# Patient Record
Sex: Female | Born: 1965 | Race: White | Hispanic: No | Marital: Married | Smoking: Never smoker
Health system: Southern US, Community
[De-identification: ages and names within clinical notes are randomized; demographics above are authoritative.]

## PROBLEM LIST (undated history)

## (undated) DIAGNOSIS — G43909 Migraine, unspecified, not intractable, without status migrainosus: Secondary | ICD-10-CM

## (undated) HISTORY — PX: ANKLE SURGERY: SHX546

## (undated) HISTORY — PX: TUBAL LIGATION: SHX77

---

## 2007-02-07 DIAGNOSIS — G43109 Migraine with aura, not intractable, without status migrainosus: Secondary | ICD-10-CM

## 2007-02-07 HISTORY — DX: Migraine with aura, not intractable, without status migrainosus: G43.109

## 2018-12-10 DIAGNOSIS — K7689 Other specified diseases of liver: Secondary | ICD-10-CM | POA: Insufficient documentation

## 2021-07-11 ENCOUNTER — Encounter (HOSPITAL_COMMUNITY): Payer: Self-pay | Admitting: *Deleted

## 2021-07-11 ENCOUNTER — Emergency Department (HOSPITAL_COMMUNITY): Payer: Managed Care, Other (non HMO)

## 2021-07-11 ENCOUNTER — Observation Stay (HOSPITAL_COMMUNITY)
Admission: EM | Admit: 2021-07-11 | Discharge: 2021-07-12 | Disposition: A | Discharge status: Home / Self Care | Payer: Managed Care, Other (non HMO) | Attending: Internal Medicine | Admitting: Internal Medicine

## 2021-07-11 DIAGNOSIS — E785 Hyperlipidemia, unspecified: Secondary | ICD-10-CM

## 2021-07-11 DIAGNOSIS — G43809 Other migraine, not intractable, without status migrainosus: Secondary | ICD-10-CM | POA: Diagnosis not present

## 2021-07-11 DIAGNOSIS — I10 Essential (primary) hypertension: Secondary | ICD-10-CM | POA: Diagnosis not present

## 2021-07-11 DIAGNOSIS — Z79899 Other long term (current) drug therapy: Secondary | ICD-10-CM | POA: Insufficient documentation

## 2021-07-11 DIAGNOSIS — E876 Hypokalemia: Secondary | ICD-10-CM

## 2021-07-11 DIAGNOSIS — R531 Weakness: Secondary | ICD-10-CM | POA: Diagnosis present

## 2021-07-11 DIAGNOSIS — R569 Unspecified convulsions: Secondary | ICD-10-CM

## 2021-07-11 DIAGNOSIS — G43909 Migraine, unspecified, not intractable, without status migrainosus: Secondary | ICD-10-CM

## 2021-07-11 DIAGNOSIS — G43109 Migraine with aura, not intractable, without status migrainosus: Secondary | ICD-10-CM

## 2021-07-11 DIAGNOSIS — G459 Transient cerebral ischemic attack, unspecified: Principal | ICD-10-CM | POA: Insufficient documentation

## 2021-07-11 HISTORY — DX: Migraine, unspecified, not intractable, without status migrainosus: G43.909

## 2021-07-11 LAB — DIFFERENTIAL
Abs Immature Granulocytes: 0.02 10*3/uL (ref 0.00–0.07)
Basophils Absolute: 0 10*3/uL (ref 0.0–0.1)
Basophils Relative: 0 %
Eosinophils Absolute: 0 10*3/uL (ref 0.0–0.5)
Eosinophils Relative: 1 %
Immature Granulocytes: 0 %
Lymphocytes Relative: 27 %
Lymphs Abs: 1.5 10*3/uL (ref 0.7–4.0)
Monocytes Absolute: 0.3 10*3/uL (ref 0.1–1.0)
Monocytes Relative: 6 %
Neutro Abs: 3.7 10*3/uL (ref 1.7–7.7)
Neutrophils Relative %: 66 %

## 2021-07-11 LAB — COMPREHENSIVE METABOLIC PANEL
ALT: 27 U/L (ref 0–44)
AST: 25 U/L (ref 15–41)
Albumin: 4.2 g/dL (ref 3.5–5.0)
Alkaline Phosphatase: 43 U/L (ref 38–126)
Anion gap: 13 (ref 5–15)
BUN: 11 mg/dL (ref 6–20)
CO2: 20 mmol/L — ABNORMAL LOW (ref 22–32)
Calcium: 9.3 mg/dL (ref 8.9–10.3)
Chloride: 103 mmol/L (ref 98–111)
Creatinine, Ser: 0.79 mg/dL (ref 0.44–1.00)
GFR, Estimated: >60 mL/min (ref >60)
Glucose, Bld: 137 mg/dL — ABNORMAL HIGH (ref 70–99)
Potassium: 3 mmol/L — ABNORMAL LOW (ref 3.5–5.1)
Sodium: 136 mmol/L (ref 135–145)
Total Bilirubin: 0.9 mg/dL (ref 0.3–1.2)
Total Protein: 6.4 g/dL — ABNORMAL LOW (ref 6.5–8.1)

## 2021-07-11 LAB — I-STAT CHEM 8, ED
BUN: 12 mg/dL (ref 6–20)
Calcium, Ion: 1.11 mmol/L — ABNORMAL LOW (ref 1.15–1.40)
Chloride: 103 mmol/L (ref 98–111)
Creatinine, Ser: 0.7 mg/dL (ref 0.44–1.00)
Glucose, Bld: 133 mg/dL — ABNORMAL HIGH (ref 70–99)
HCT: 38 % (ref 36.0–46.0)
Hemoglobin: 12.9 g/dL (ref 12.0–15.0)
Potassium: 3.1 mmol/L — ABNORMAL LOW (ref 3.5–5.1)
Sodium: 137 mmol/L (ref 135–145)
TCO2: 22 mmol/L (ref 22–32)

## 2021-07-11 LAB — CBC
HCT: 37.8 % (ref 36.0–46.0)
Hemoglobin: 12.8 g/dL (ref 12.0–15.0)
MCH: 29.6 pg (ref 26.0–34.0)
MCHC: 33.9 g/dL (ref 30.0–36.0)
MCV: 87.5 fL (ref 80.0–100.0)
Platelets: 247 10*3/uL (ref 150–400)
RBC: 4.32 MIL/uL (ref 3.87–5.11)
RDW: 12 % (ref 11.5–15.5)
WBC: 5.6 10*3/uL (ref 4.0–10.5)
nRBC: 0 % (ref 0.0–0.2)

## 2021-07-11 LAB — I-STAT BETA HCG BLOOD, ED (MC, WL, AP ONLY): I-stat hCG, quantitative: 6.4 m[IU]/mL — ABNORMAL HIGH (ref <5)

## 2021-07-11 LAB — APTT: aPTT: 29 seconds (ref 24–36)

## 2021-07-11 LAB — PROTIME-INR
INR: 1.2 (ref 0.8–1.2)
Prothrombin Time: 14.9 seconds (ref 11.4–15.2)

## 2021-07-11 LAB — CBG MONITORING, ED: Glucose-Capillary: 129 mg/dL — ABNORMAL HIGH (ref 70–99)

## 2021-07-11 IMAGING — MR MR MRA NECK WO/W CM
4 of 7 series · 20 of 48 positions shown · IV contrast (Yes GAD)
Comparison: None.

CLINICAL DATA: Transient ischemic attack

EXAM:
MRA NECK WITHOUT AND WITH CONTRAST
TECHNIQUE: Multiplanar and multiecho pulse sequences of the neck were obtained
without and with intravenous contrast. Angiographic images of the
neck were obtained using MRA technique without and with intravenous
contrast.
CONTRAST:  6.5mL GADAVIST GADOBUTROL 1 MMOL/ML IV SOLN

[Series 500: cor cemra ft · coronal · 1.2mm · 0.59mm/px · 7 of 105 slices shown]
[im 1/105]
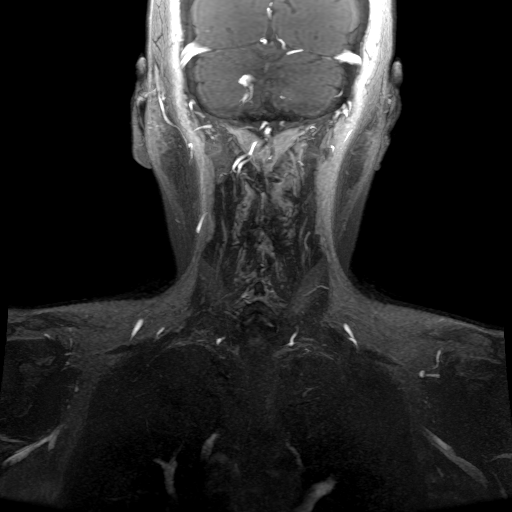
[im 18/105]
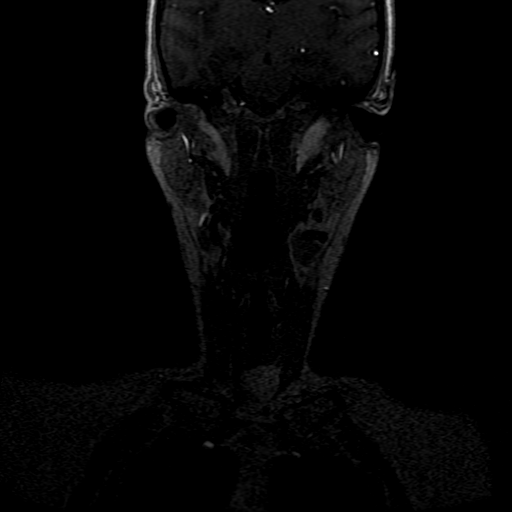
[im 35/105]
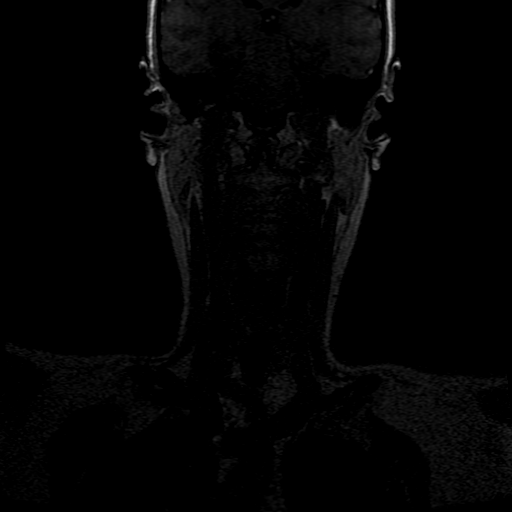
[im 53/105]
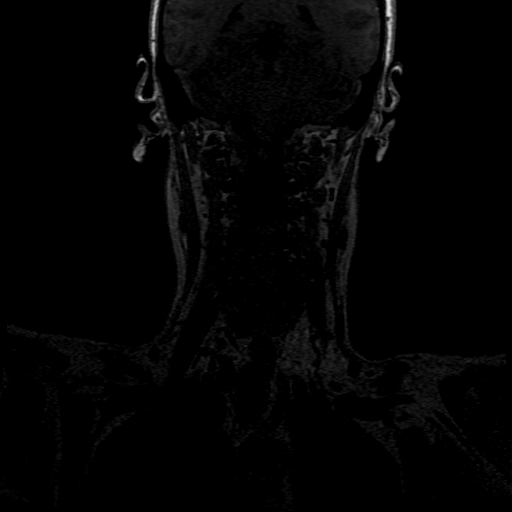
[im 70/105]
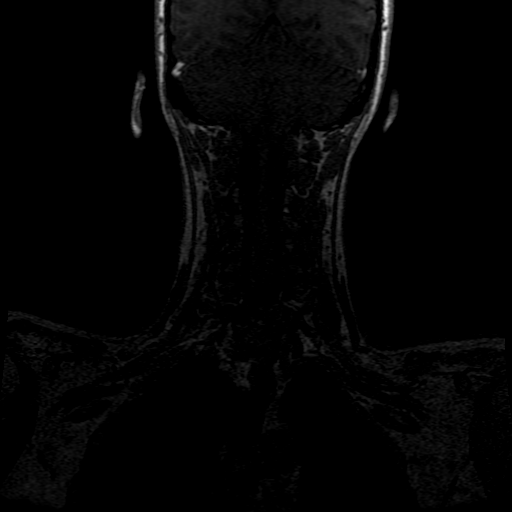
[im 87/105]
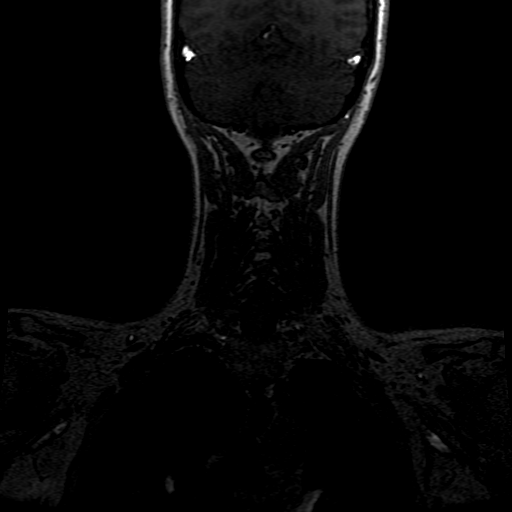
[im 105/105]
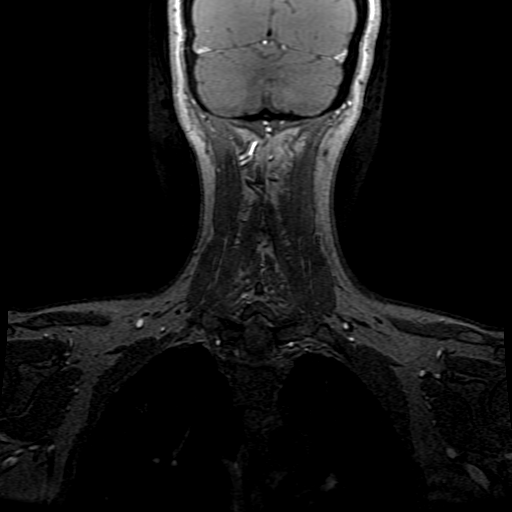

[Series 501: ph1/cor cemra ft · coronal · 1.2mm · 0.59mm/px · 7 of 104 slices shown]
[im 1/104]
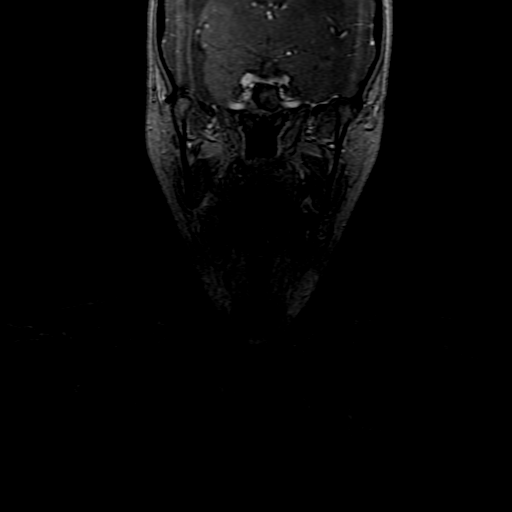
[im 18/104]
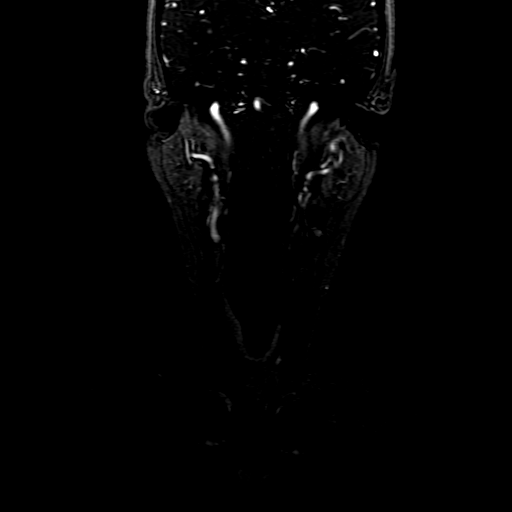
[im 35/104]
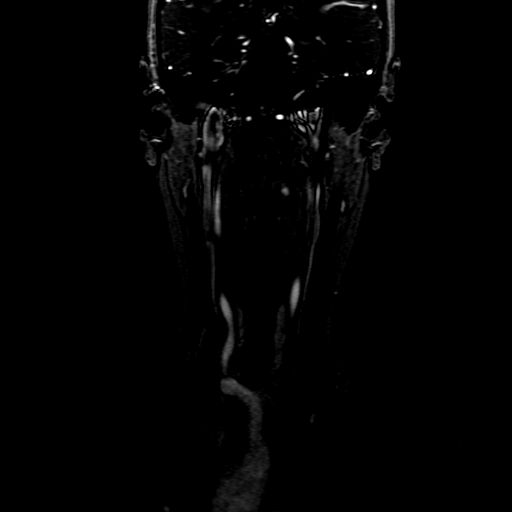
[im 52/104]
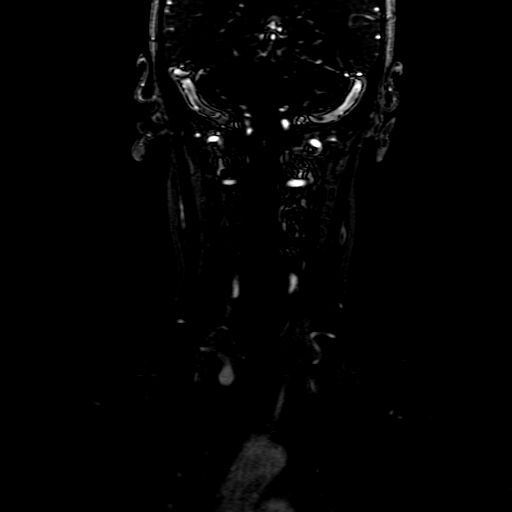
[im 69/104]
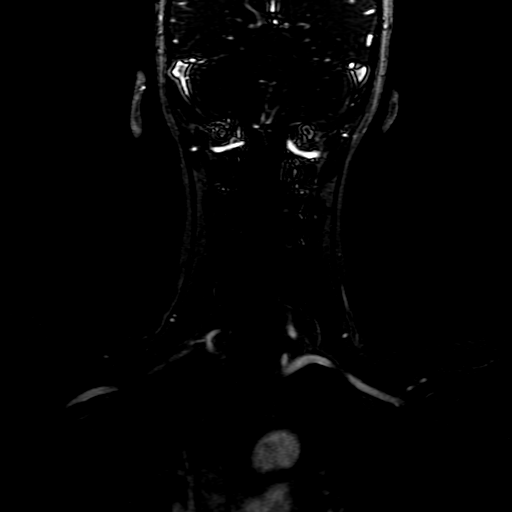
[im 86/104]
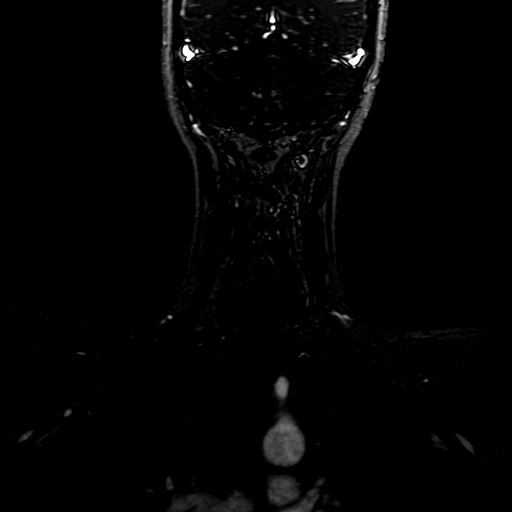
[im 104/104]
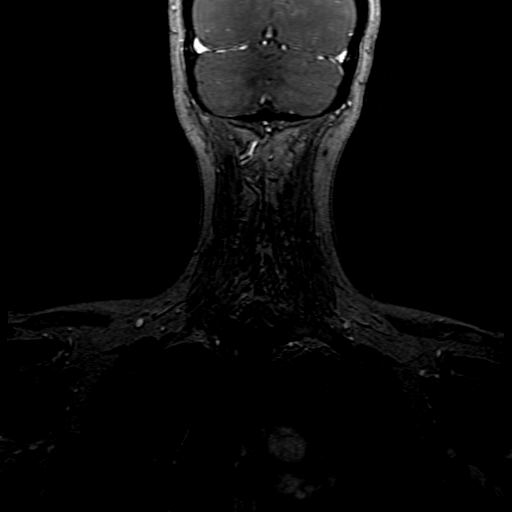

[Series 502: ph2/cor cemra ft · coronal · 1.2mm · 0.59mm/px · 3 of 104 slices shown]
[im 18/104]
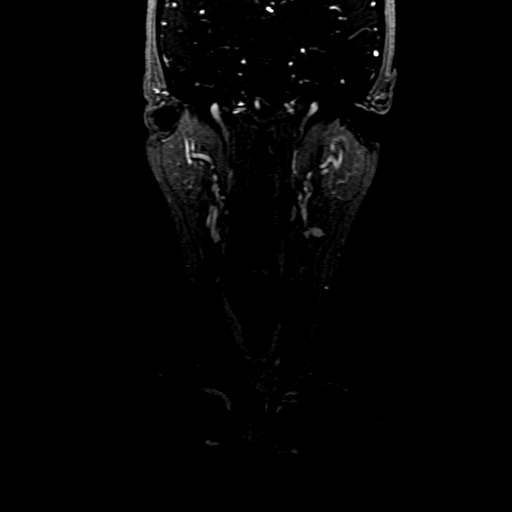
[im 52/104]
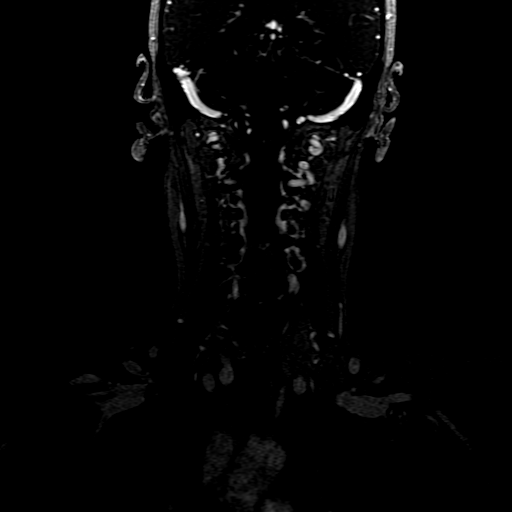
[im 86/104]
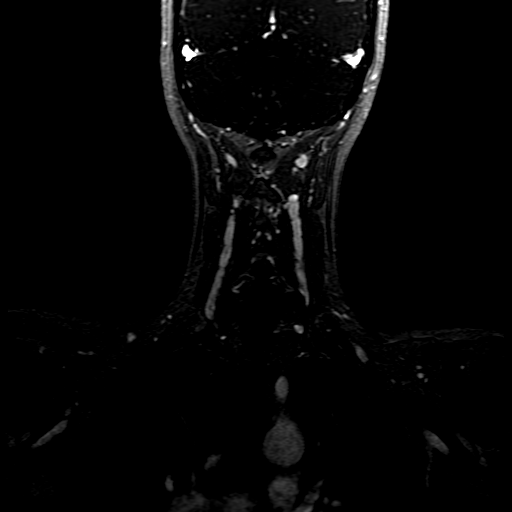

[((date))-((date)) · coronal · 1.2mm · 0.59mm/px · 3 of 105 slices shown]
[im 18/105]
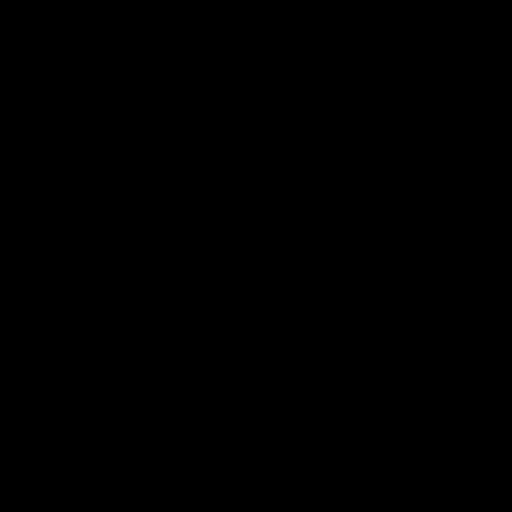
[im 53/105]
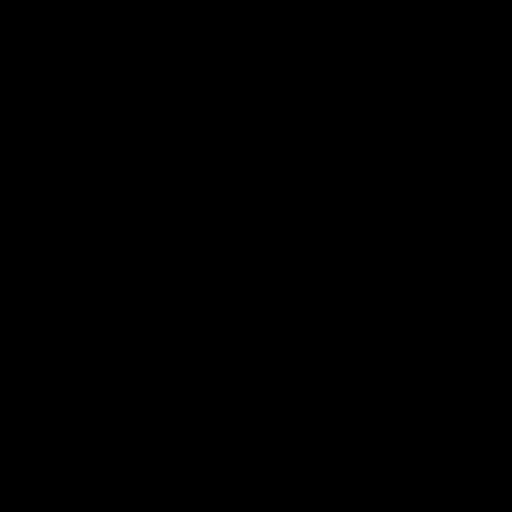
[im 87/105]
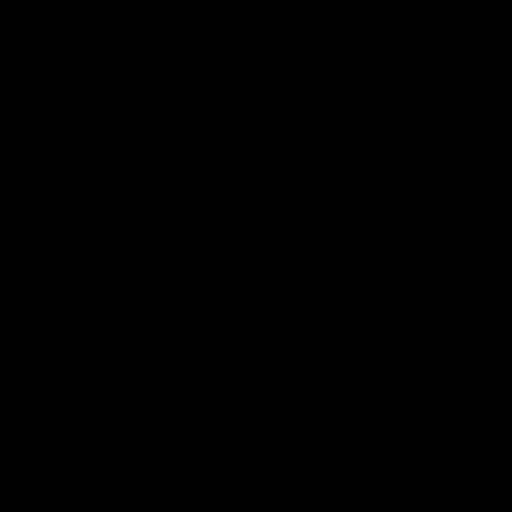

[20 of 48 positions shown; findings below may reference images not displayed]

FINDINGS: Normal visualized aortic arch. Normal carotid and vertebral systems.
Vertebral system is codominant.
IMPRESSION: Normal MRA of the neck.

## 2021-07-11 IMAGING — CT CT HEAD CODE STROKE
4 series · 16 of 47 positions shown, 18 images · non-contrast
Comparison: None.

CLINICAL DATA: Code stroke. Acute neuro deficit. Left visual
deficit. Left-sided numbness



[Series 3: head wo · axial · 0.41mm/px · z∈[-120,+0]mm · 7 of 33 slices shown, 9 images]
[im 5/33  brain]
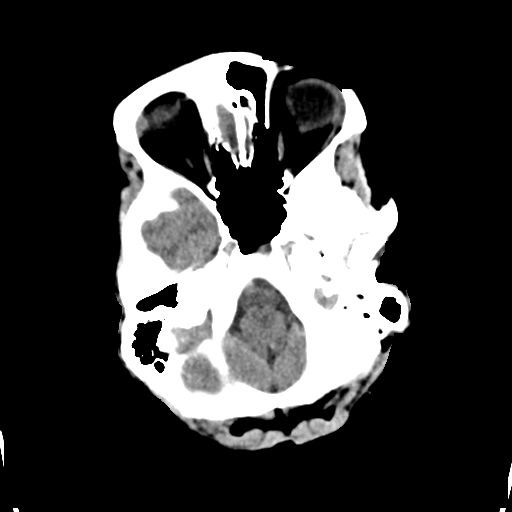
[im 5/33  bone]
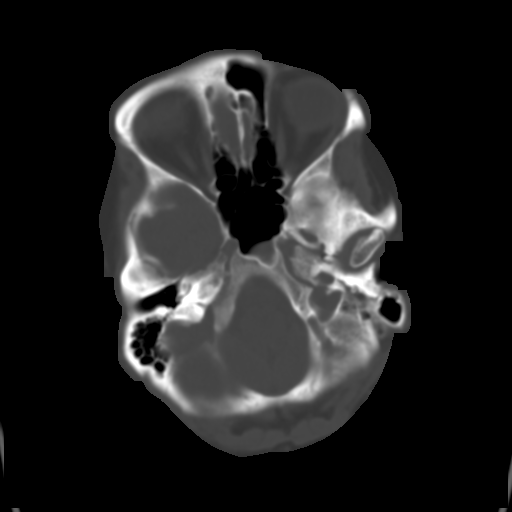
[im 9/33  brain]
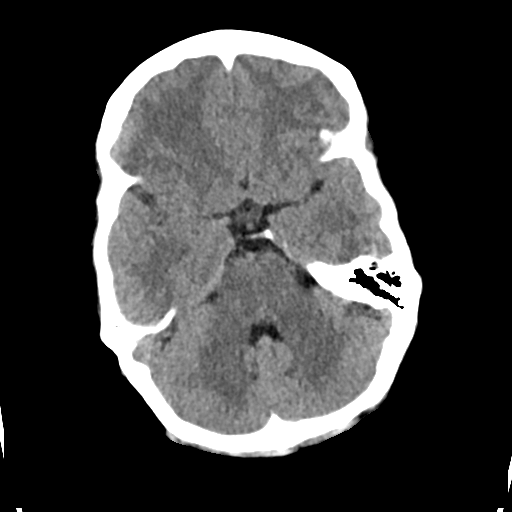
[im 13/33  brain]
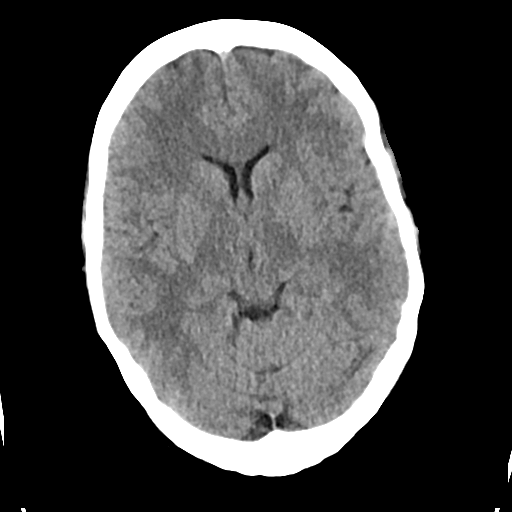
[im 17/33  brain]
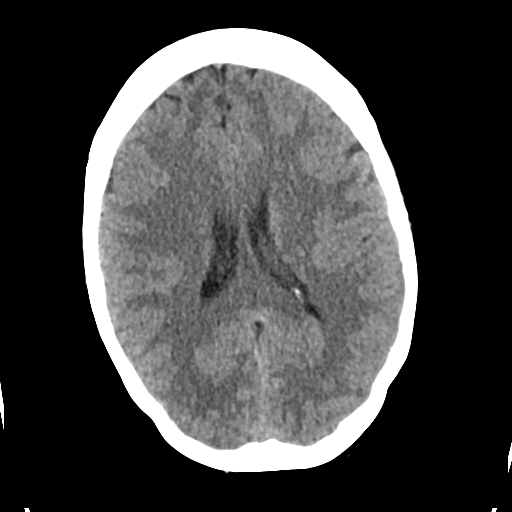
[im 21/33  brain]
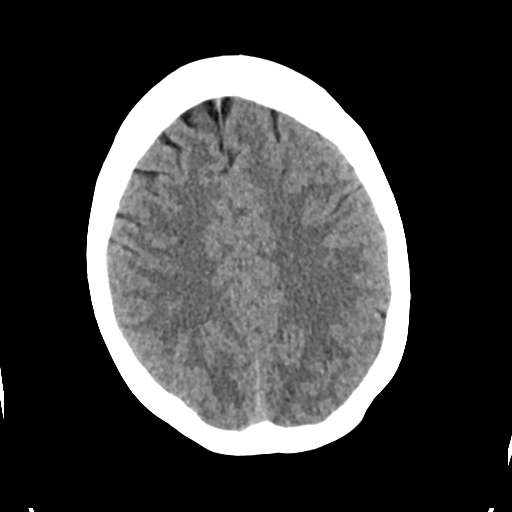
[im 21/33  bone]
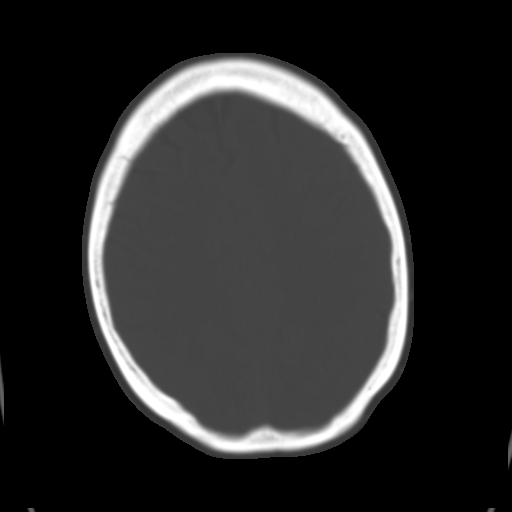
[im 25/33  brain]
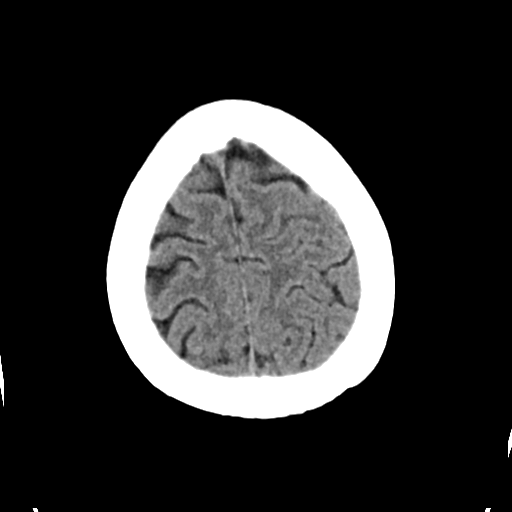
[im 29/33  brain]
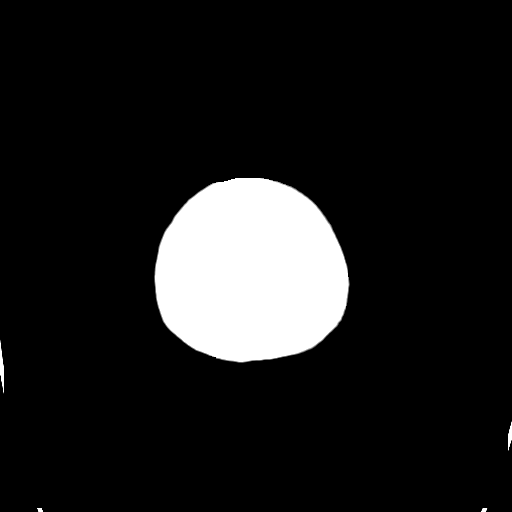

[Series 4: head bone · axial · 0.41mm/px · z∈[-124,-92]mm · 3 of 82 slices shown]
[im 9/82  bone]
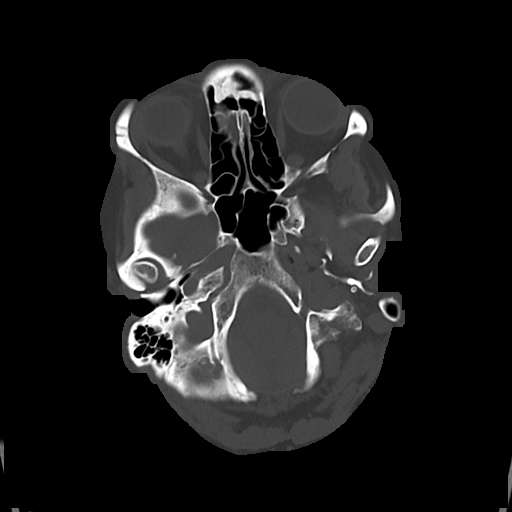
[im 17/82  bone]
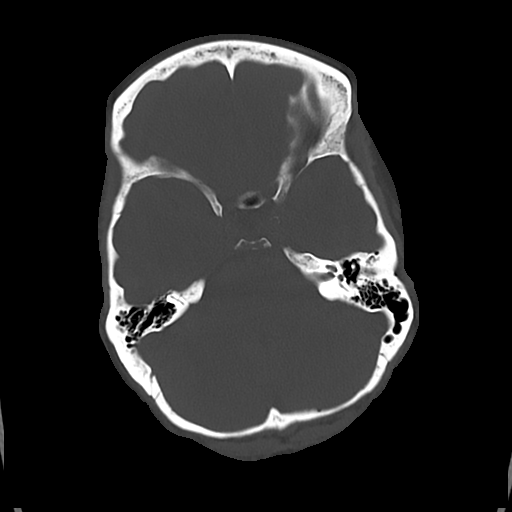
[im 25/82  bone]
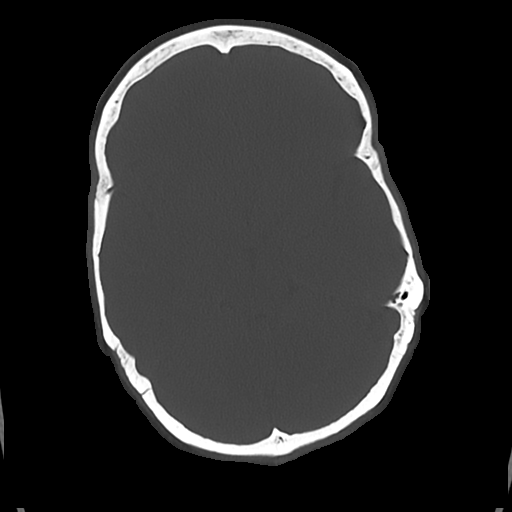

[Series 5: cor soft · coronal · 0.31mm/px · 3 of 67 slices shown]
[im 23/67  brain]
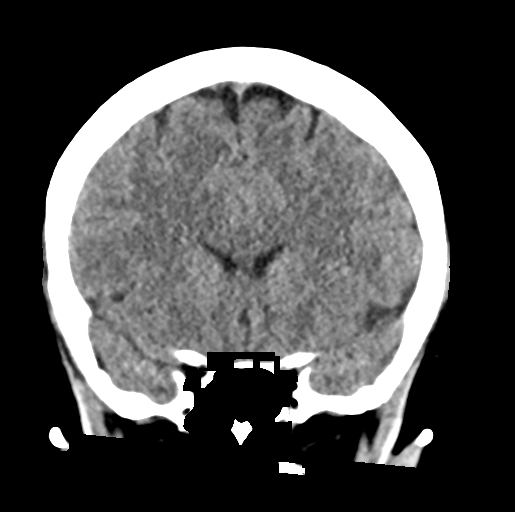
[im 30/67  brain]
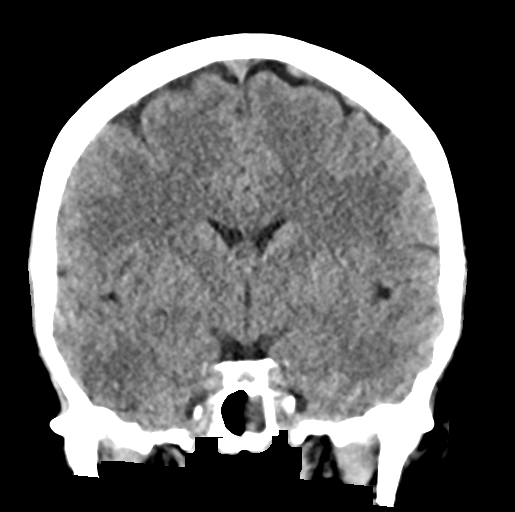
[im 37/67  brain]
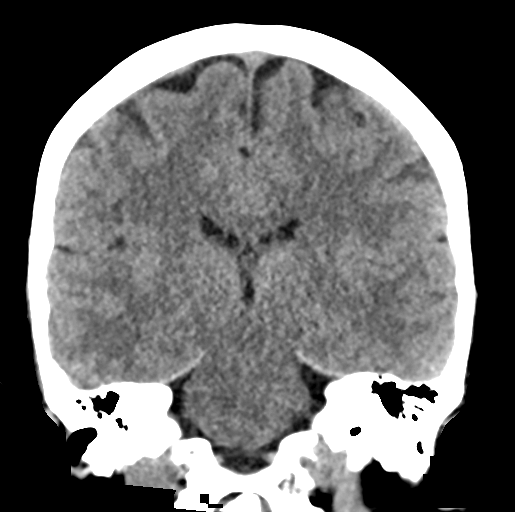

[Series 6: sag soft · sagittal · 0.31mm/px · 3 of 56 slices shown]
[im 19/56  brain]
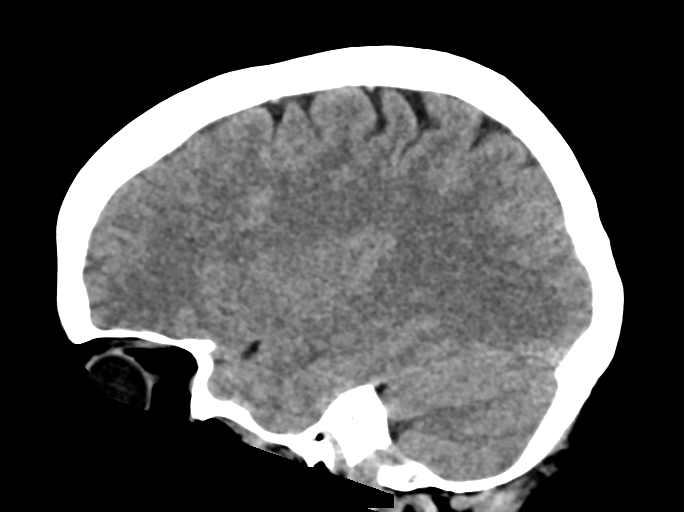
[im 28/56  brain]
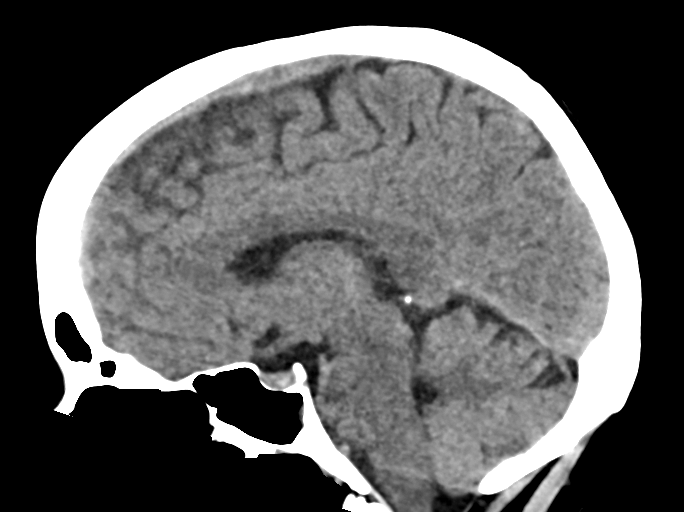
[im 37/56  brain]
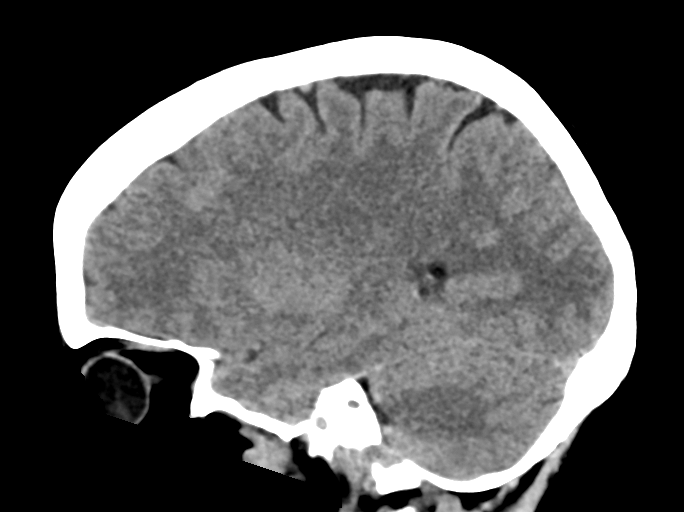

[16 of 47 positions shown; findings below may reference images not displayed]

FINDINGS: Brain: No evidence of acute infarction, hemorrhage, hydrocephalus,
extra-axial collection or mass lesion/mass effect.

Vascular: Negative for hyperdense vessel

Skull: Negative

Sinuses/Orbits: Mucosal edema left sphenoid sinus. Remaining sinuses
clear. Negative orbit

Other: None

ASPECTS (Alberta Stroke Program Early CT Score)

- Ganglionic level infarction (caudate, lentiform nuclei, internal
capsule, insula, M1-M3 cortex): 7

- Supraganglionic infarction (M4-M6 cortex): 3

Total score (0-10 with 10 being normal): 10
IMPRESSION: 1. Normal CT head
2. ASPECTS is 10
3. Code stroke imaging results were communicated on [DATE] at
[DATE] to provider ELIENNE via secure text page

## 2021-07-11 IMAGING — MR MR HEAD W/O CM
6 of 11 series · 24 of 48 positions shown · non-contrast
Comparison: No pertinent prior exam.

CLINICAL DATA: Acute neurologic deficit

EXAM:
MRI HEAD WITHOUT CONTRAST
MRA HEAD WITHOUT CONTRAST
TECHNIQUE: Multiplanar, multi-echo pulse sequences of the brain and surrounding
structures were acquired without intravenous contrast. Angiographic
images of the Circle of Willis were acquired using MRA technique
without intravenous contrast.

[Series 2: DWI · axial · 3.0mm · 0.94mm/px · z∈[-97,+51]mm · 7 of 106 slices shown (1 of 2)]
[im 1/106]
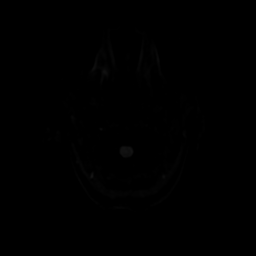
[im 18/106]
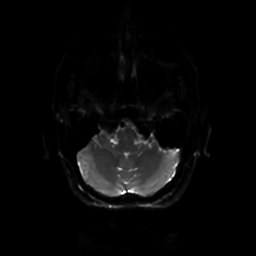
[im 36/106]
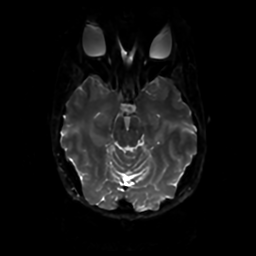
[im 53/106]
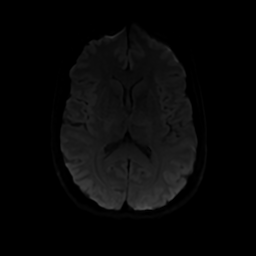
[im 71/106]
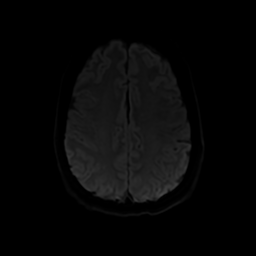
[im 88/106]
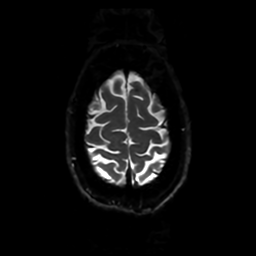
[im 106/106]
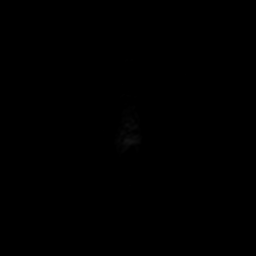

[Series 3: DWI · coronal · 4.0mm · 0.94mm/px · 5 of 76 slices shown (2 of 2)]
[im 1/76]
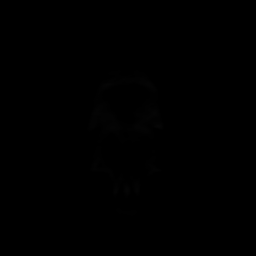
[im 19/76]
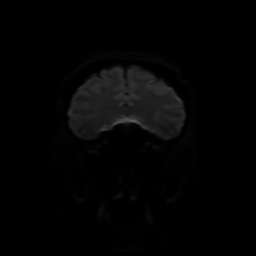
[im 38/76]
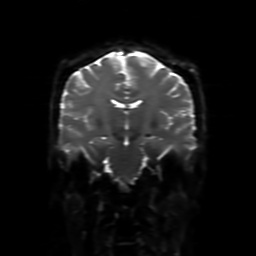
[im 57/76]
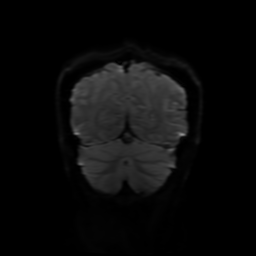
[im 76/76]
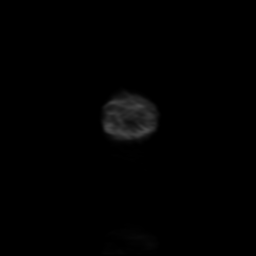

[Series 4: FLAIR · sagittal · 5.0mm · 0.23mm/px · 2 of 25 slices shown (1 of 2)]
[im 1/25]
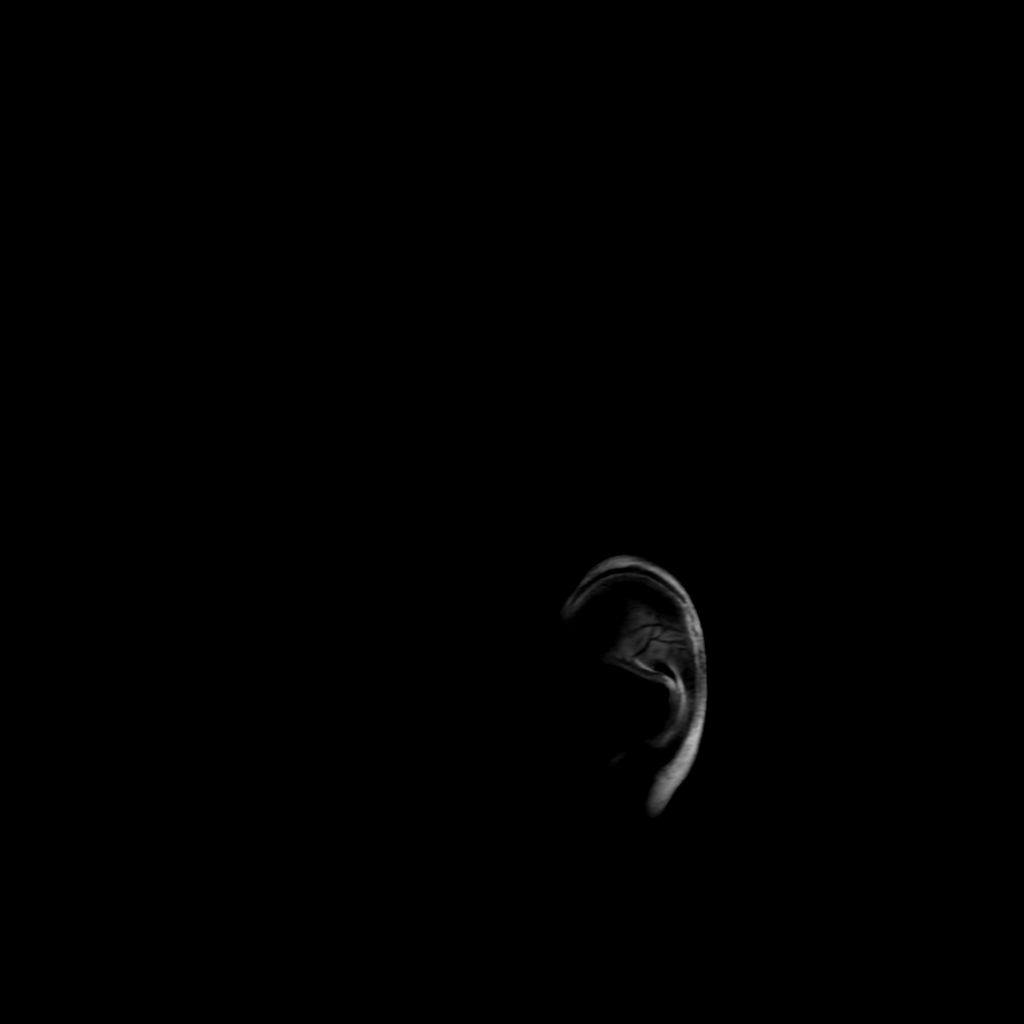
[im 25/25]
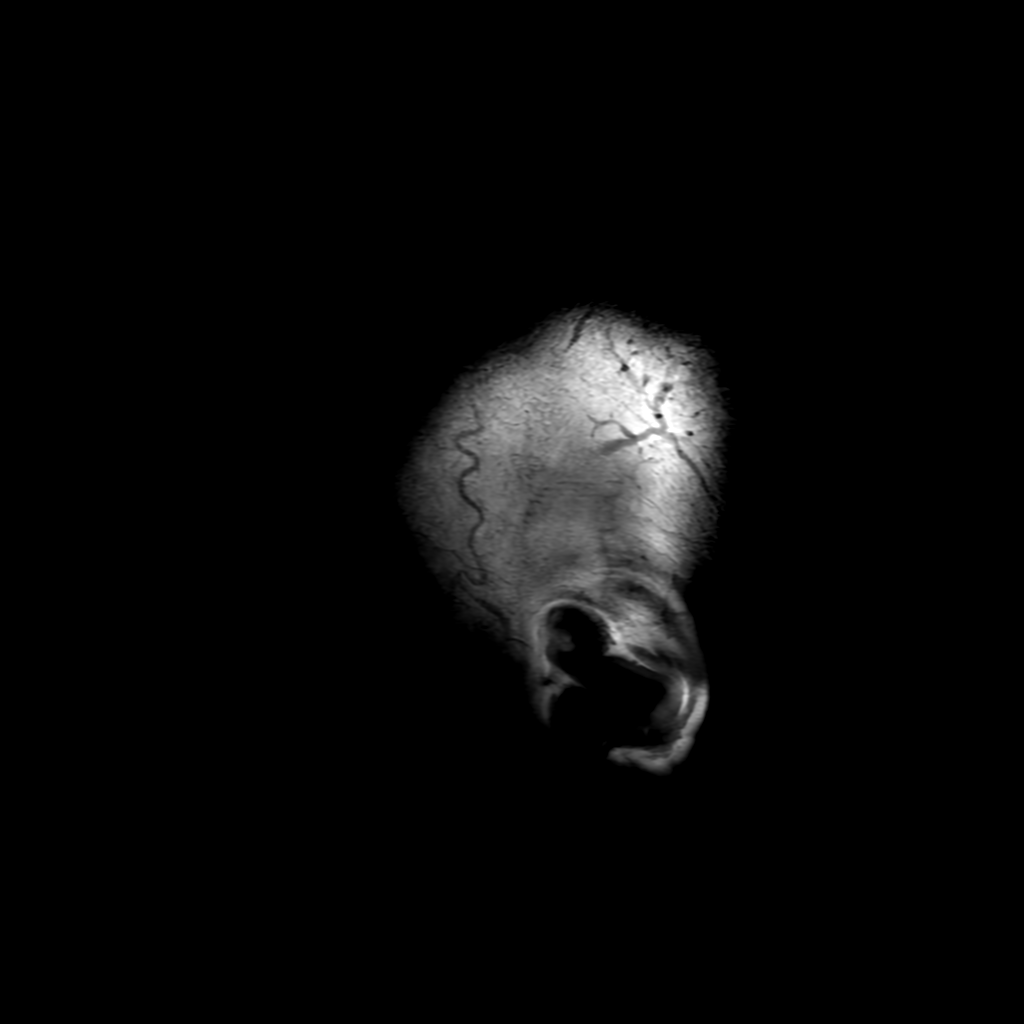

[Series 6: FLAIR · axial · 4.0mm · 0.45mm/px · z∈[-92,+60]mm · 3 of 37 slices shown (2 of 2)]
[im 1/37]
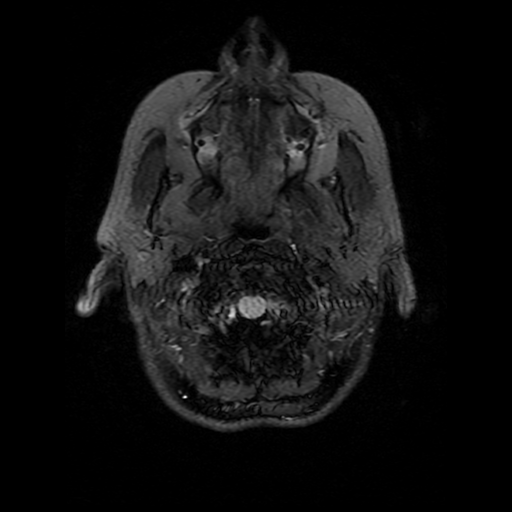
[im 19/37]
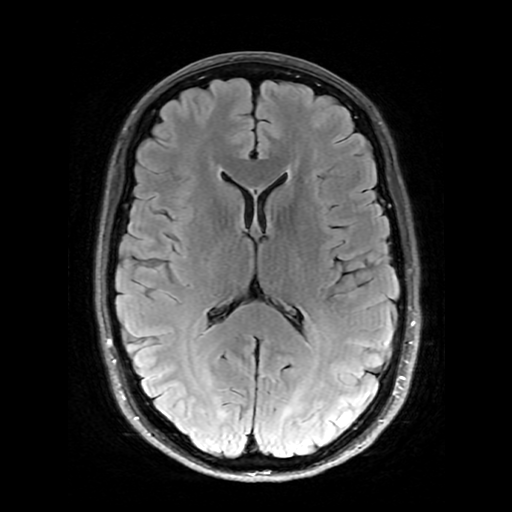
[im 37/37]
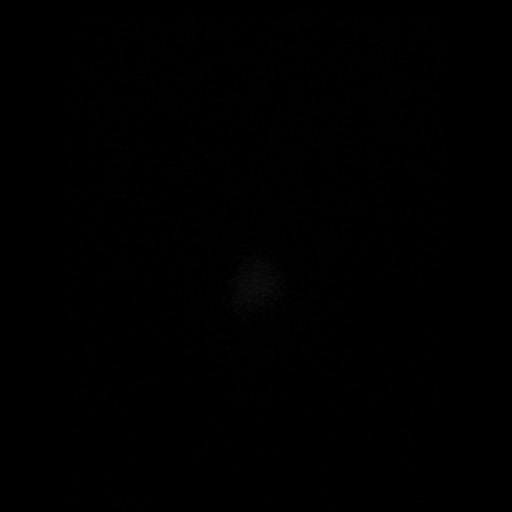

[Series 250: ADC · axial · 3.0mm · 0.94mm/px · z∈[-97,+51]mm · 4 of 52 slices shown (1 of 2)]
[im 1/52]
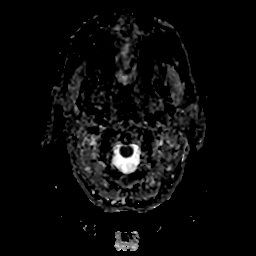
[im 18/52]
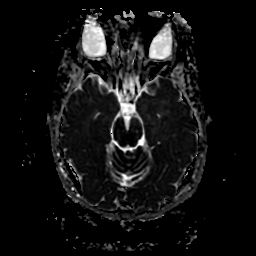
[im 35/52]
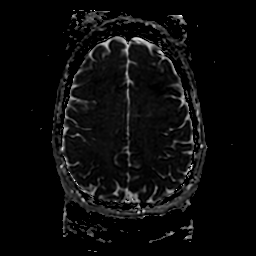
[im 52/52]
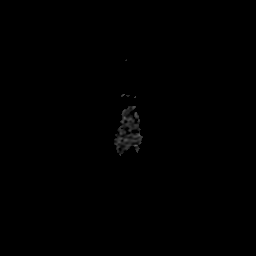

[Series 350: ADC · coronal · 4.0mm · 0.94mm/px · 3 of 38 slices shown (2 of 2)]
[im 1/38]
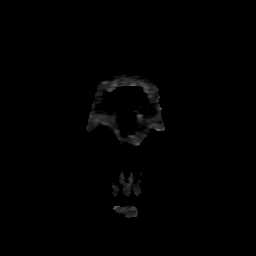
[im 19/38]
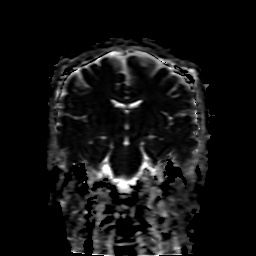
[im 38/38]
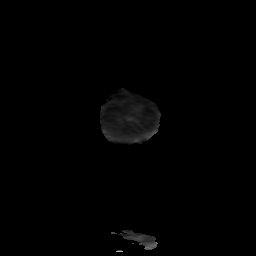

[24 of 48 positions shown; findings below may reference images not displayed]

FINDINGS: MRI HEAD FINDINGS

Brain: No acute infarct, mass effect or extra-axial collection. No
acute or chronic hemorrhage. Normal white matter signal, parenchymal
volume and CSF spaces. The midline structures are normal.

Vascular: Major flow voids are preserved.

Skull and upper cervical spine: Normal calvarium and skull base.
Visualized upper cervical spine and soft tissues are normal.

Sinuses/Orbits:No paranasal sinus fluid levels or advanced mucosal
thickening. No mastoid or middle ear effusion. Normal orbits.

MRA HEAD FINDINGS

POSTERIOR CIRCULATION:

--Vertebral arteries: Normal

--Inferior cerebellar arteries: Normal.

--Basilar artery: Normal.

--Superior cerebellar arteries: Normal.

--Posterior cerebral arteries: Normal.

ANTERIOR CIRCULATION:

--Intracranial internal carotid arteries: Normal.

--Anterior cerebral arteries (ACA): Normal.

--Middle cerebral arteries (MCA): Normal.

ANATOMIC VARIANTS: None
IMPRESSION: Normal MRI/MRA of the brain.

## 2021-07-11 IMAGING — MR MR MRA HEAD W/O CM
2 series · 19 of 48 positions shown · non-contrast
Comparison: No pertinent prior exam.

CLINICAL DATA: Acute neurologic deficit

EXAM:
MRI HEAD WITHOUT CONTRAST
MRA HEAD WITHOUT CONTRAST
TECHNIQUE: Multiplanar, multi-echo pulse sequences of the brain and surrounding
structures were acquired without intravenous contrast. Angiographic
images of the Circle of Willis were acquired using MRA technique
without intravenous contrast.

[Series 2: ax (id) · axial · 1.0mm · 0.43mm/px · z∈[-83,-3]mm · 18 of 176 slices shown]
[im 1/176]
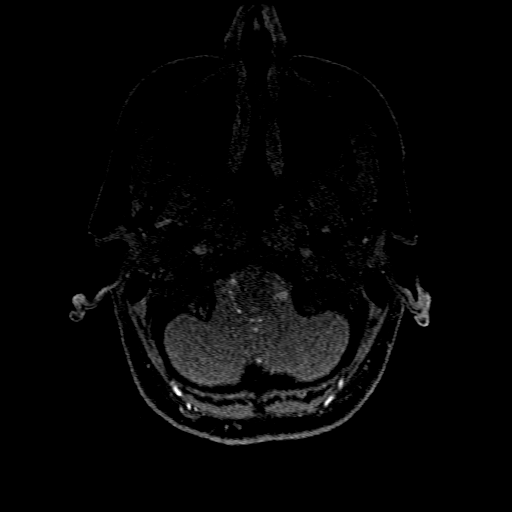
[im 4/176]
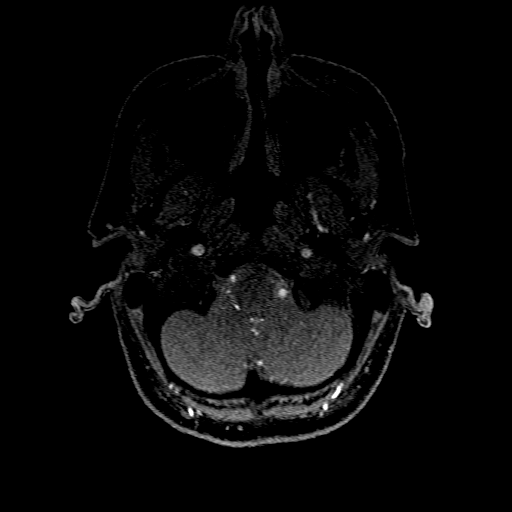
[im 8/176]
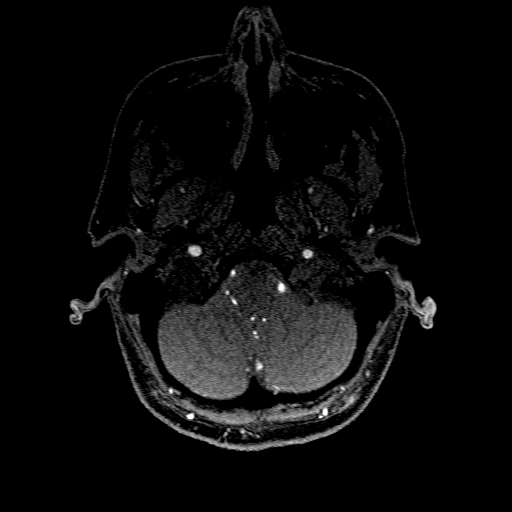
[im 12/176]
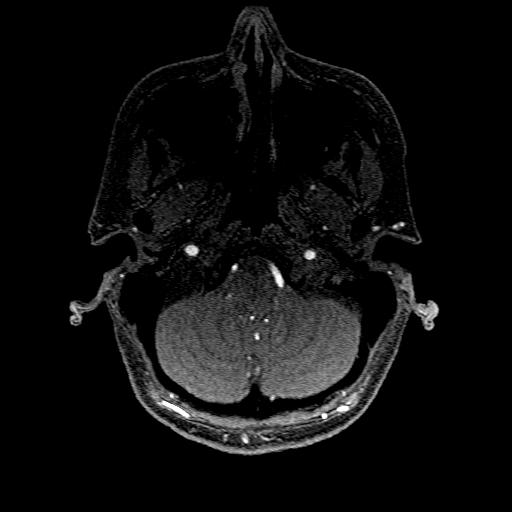
[im 16/176]
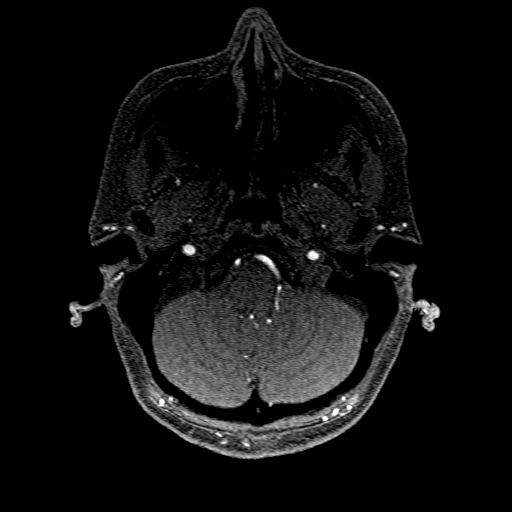
[im 20/176]
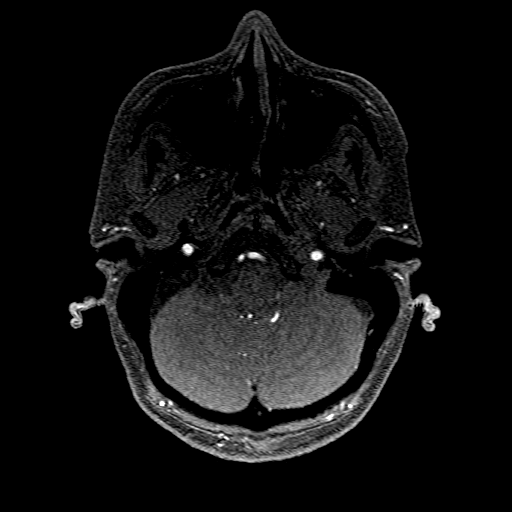
[im 23/176]
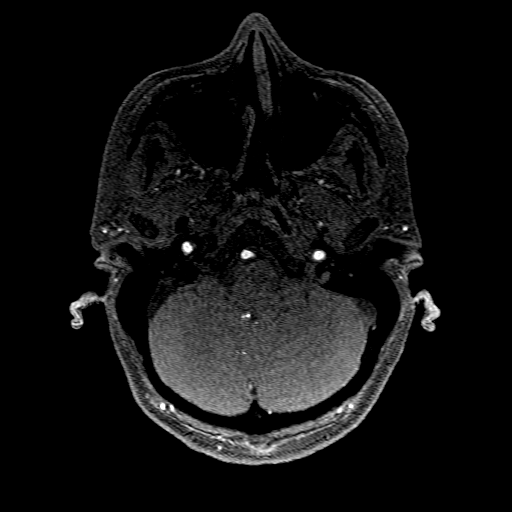
[im 27/176]
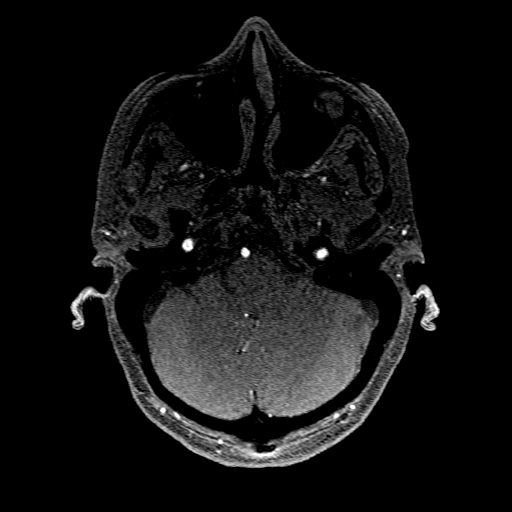
[im 31/176]
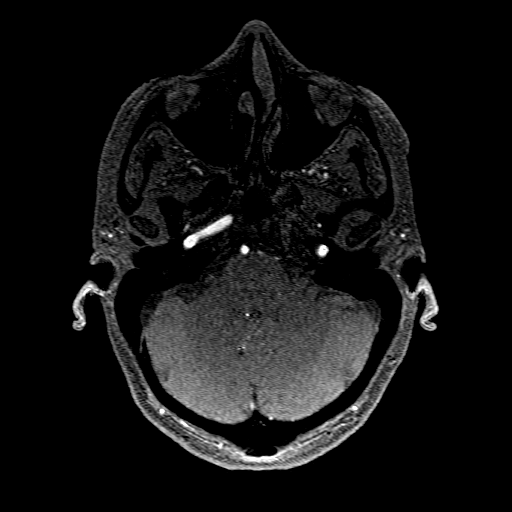
[im 35/176]
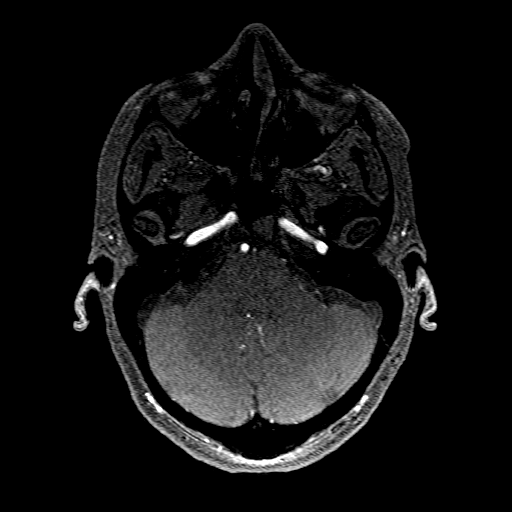
[im 54/176]
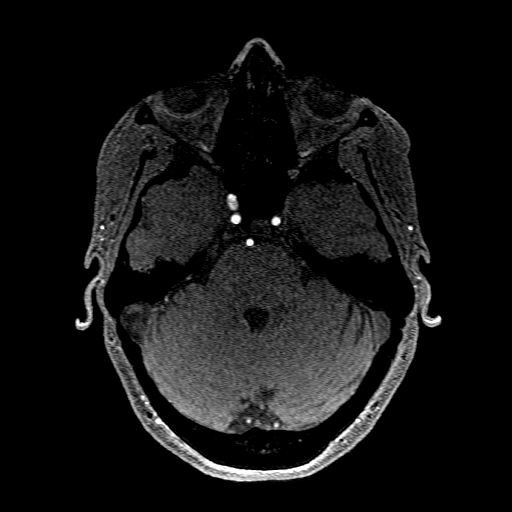
[im 77/176]
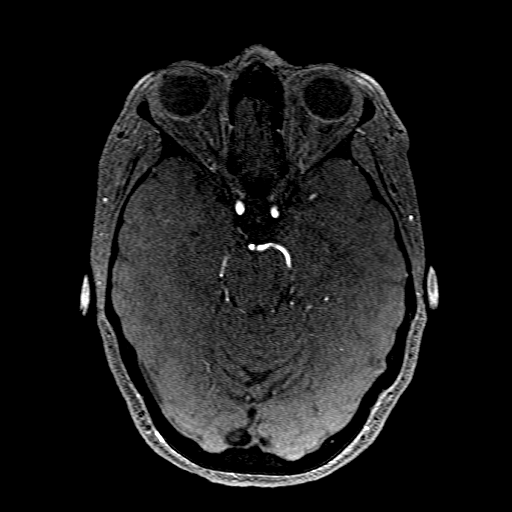
[im 88/176]
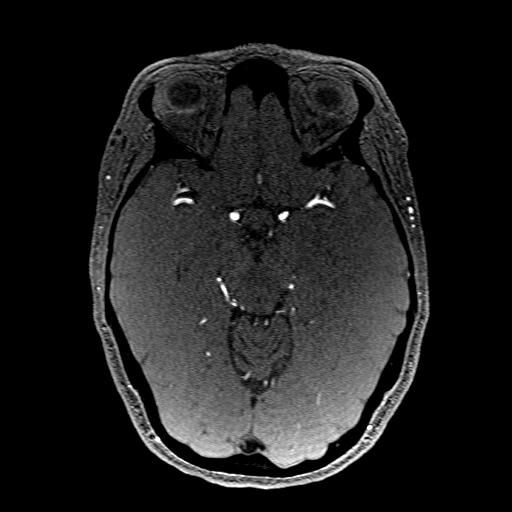
[im 99/176]
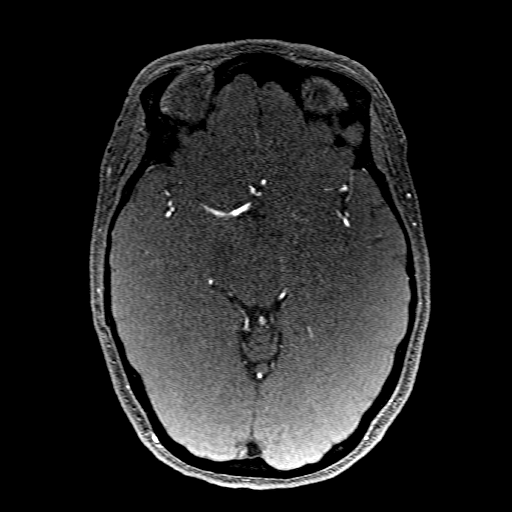
[im 122/176]
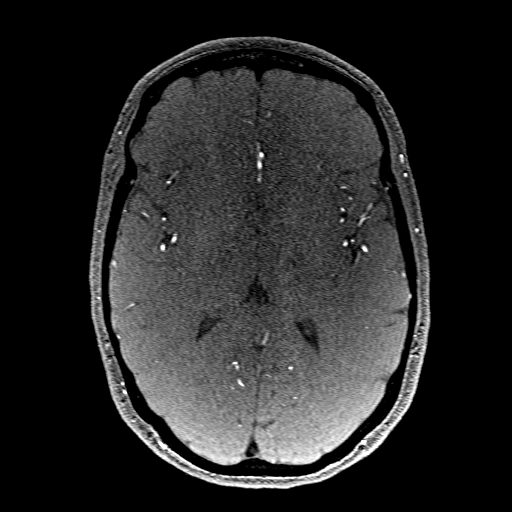
[im 145/176]
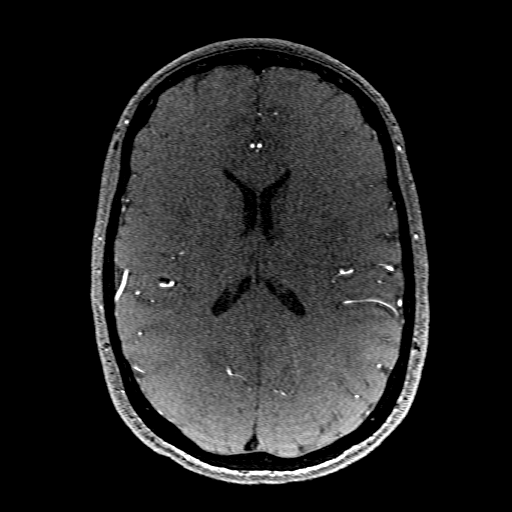
[im 149/176]
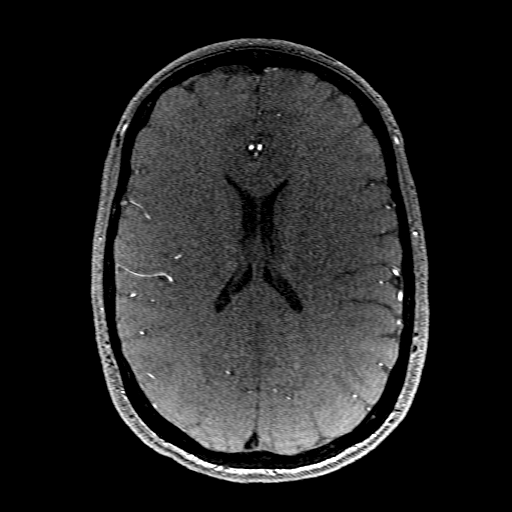
[im 168/176]
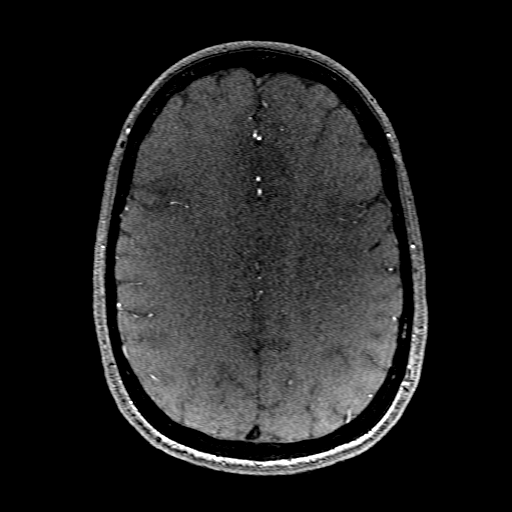

[Series 201: pjn:ax (id) · sagittal · 1.0mm · 0.43mm/px · 1 of 5 slices shown]
[im 1/5]
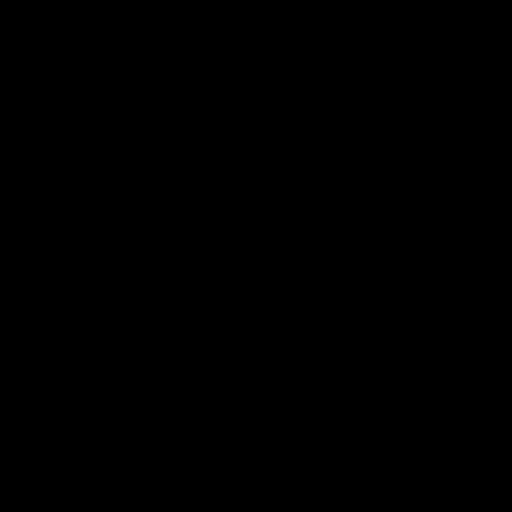

[19 of 48 positions shown; findings below may reference images not displayed]

FINDINGS: MRI HEAD FINDINGS

Brain: No acute infarct, mass effect or extra-axial collection. No
acute or chronic hemorrhage. Normal white matter signal, parenchymal
volume and CSF spaces. The midline structures are normal.

Vascular: Major flow voids are preserved.

Skull and upper cervical spine: Normal calvarium and skull base.
Visualized upper cervical spine and soft tissues are normal.

Sinuses/Orbits:No paranasal sinus fluid levels or advanced mucosal
thickening. No mastoid or middle ear effusion. Normal orbits.

MRA HEAD FINDINGS

POSTERIOR CIRCULATION:

--Vertebral arteries: Normal

--Inferior cerebellar arteries: Normal.

--Basilar artery: Normal.

--Superior cerebellar arteries: Normal.

--Posterior cerebral arteries: Normal.

ANTERIOR CIRCULATION:

--Intracranial internal carotid arteries: Normal.

--Anterior cerebral arteries (ACA): Normal.

--Middle cerebral arteries (MCA): Normal.

ANATOMIC VARIANTS: None
IMPRESSION: Normal MRI/MRA of the brain.

## 2021-07-11 MED ORDER — SODIUM CHLORIDE 0.9% FLUSH: 3.0000 mL | Freq: Once | INTRAVENOUS | Status: DC

## 2021-07-11 MED ORDER — ACETAMINOPHEN 325 MG PO TABS
650.0000 mg | ORAL_TABLET | ORAL | Status: DC | PRN
  Administered 2021-07-12: 650 mg via ORAL
  Filled 2021-07-11: qty 2

## 2021-07-11 MED ORDER — TIZANIDINE HCL 4 MG PO TABS
2.0000 mg | ORAL_TABLET | Freq: Every day | ORAL | Status: DC
  Administered 2021-07-12: 2 mg via ORAL
  Filled 2021-07-11: qty 1

## 2021-07-11 MED ORDER — AMITRIPTYLINE HCL 10 MG PO TABS
10.0000 mg | ORAL_TABLET | Freq: Every day | ORAL | Status: DC
Start: 2021-07-12 — End: 2021-07-12

## 2021-07-11 MED ORDER — GADOBUTROL 1 MMOL/ML IV SOLN
6.5000 mL | Freq: Once | INTRAVENOUS | Status: AC | PRN
  Administered 2021-07-11: 6.5 mL via INTRAVENOUS

## 2021-07-11 MED ORDER — ACETAMINOPHEN 650 MG RE SUPP: 650.0000 mg | RECTAL | Status: DC | PRN

## 2021-07-11 MED ORDER — STROKE: EARLY STAGES OF RECOVERY BOOK
Freq: Once | Status: AC
  Filled 2021-07-11: qty 1

## 2021-07-11 MED ORDER — ASPIRIN 81 MG PO CHEW
324.0000 mg | CHEWABLE_TABLET | Freq: Once | ORAL | Status: AC
  Administered 2021-07-11: 324 mg via ORAL
  Filled 2021-07-11: qty 4

## 2021-07-11 MED ORDER — ACETAMINOPHEN 160 MG/5ML PO SOLN: 650.0000 mg | ORAL | Status: DC | PRN

## 2021-07-11 MED ORDER — ASPIRIN 325 MG PO TABS
325.0000 mg | ORAL_TABLET | Freq: Every day | ORAL | Status: DC
  Filled 2021-07-11: qty 1

## 2021-07-11 MED ORDER — SENNOSIDES-DOCUSATE SODIUM 8.6-50 MG PO TABS: 1.0000 | ORAL_TABLET | Freq: Every evening | ORAL | Status: DC | PRN

## 2021-07-11 MED ORDER — CLOPIDOGREL BISULFATE 75 MG PO TABS
75.0000 mg | ORAL_TABLET | Freq: Every day | ORAL | Status: DC
  Administered 2021-07-12: 75 mg via ORAL
  Filled 2021-07-11: qty 1

## 2021-07-11 MED ORDER — ROSUVASTATIN CALCIUM 5 MG PO TABS
10.0000 mg | ORAL_TABLET | Freq: Every day | ORAL | Status: DC
  Administered 2021-07-12: 10 mg via ORAL
  Filled 2021-07-11: qty 2

## 2021-07-11 MED ORDER — SUMATRIPTAN SUCCINATE 100 MG PO TABS
100.0000 mg | ORAL_TABLET | ORAL | Status: DC | PRN
Start: 2021-07-11 — End: 2021-07-13
  Administered 2021-07-12: 100 mg via ORAL
  Filled 2021-07-11: qty 1

## 2021-07-11 MED ORDER — POTASSIUM CHLORIDE CRYS ER 20 MEQ PO TBCR
40.0000 meq | EXTENDED_RELEASE_TABLET | Freq: Two times a day (BID) | ORAL | Status: AC
  Administered 2021-07-12 (×2): 40 meq via ORAL
  Filled 2021-07-11 (×2): qty 2

## 2021-07-11 MED ORDER — ENOXAPARIN SODIUM 40 MG/0.4ML IJ SOSY
40.0000 mg | PREFILLED_SYRINGE | INTRAMUSCULAR | Status: DC
  Administered 2021-07-12: 40 mg via SUBCUTANEOUS
  Filled 2021-07-11: qty 0.4

## 2021-07-11 MED ORDER — ASPIRIN 300 MG RE SUPP: 300.0000 mg | Freq: Every day | RECTAL | Status: DC

## 2021-07-11 NOTE — ED Provider Notes (Signed)
?[facility] EMERGENCY DEPARTMENT ?Provider Note ? ? ?CSN: [id number] ?Arrival date & time: 07/11/21  1847 ? ?An emergency department physician performed an initial assessment on this suspected stroke patient at 9. ? ?History ? ?Chief Complaint  ?Patient presents with  ? Code Stroke  ? ? ?Dorothy Jackson is a 56 y.o. female.  Presenting to the emergency room with concern for stroke alert. ? ?History limited due to acuity. ? ?History obtained from patient primarily.  Patient developed haziness in her left eye, left-sided headache, then noted left-sided numbness.  EMS states that patient then developed upper extremity contractures and facial twitching.  All of these symptoms lasted at least 30 minutes, seem to improve when she arrived in emergency room. ? ?Patient since being in ER reports and all of her symptoms have resolved and she has no ongoing numbness, weakness, twitching, shaking. ? ?HPI ? ?  ? ?Home Medications ?Prior to Admission medications   ?Medication Sig Start Date End Date Taking? Authorizing Provider  ?amitriptyline (ELAVIL) 10 MG tablet Take 10 mg by mouth daily. 05/09/21  Yes [provider]  ?OZEMPIC, 1 MG/DOSE, 4 MG/3ML SOPN Inject 1 mg into the skin every Thursday. 06/28/21  Yes [provider]  ?rizatriptan (MAXALT) 10 MG tablet Take 10 mg by mouth daily as needed for migraine. 06/22/21  Yes [provider]  ?rosuvastatin (CRESTOR) 10 MG tablet Take 10 mg by mouth at bedtime. 06/07/21  Yes [provider]  ?tiZANidine (ZANAFLEX) 4 MG tablet Take 2-4 mg by mouth at bedtime. 05/08/21  Yes [provider]  ?   ? ?Allergies    ?Carbamazepine   ? ?Review of Systems   ?Review of Systems  ?Neurological:  Positive for tremors, facial asymmetry, weakness and numbness. Negative for speech difficulty.  ?All other systems reviewed and are negative. ? ?Physical Exam ?Updated Vital Signs ?BP 118/68   Pulse 64   Temp 98.2 ?F (36.8 ?C) (Oral)   Resp  16   Ht 5\' 4"  (1.626 m)   Wt 63 kg   SpO2 100%   BMI 23.84 kg/m?  ?Physical Exam ?Vitals and nursing note reviewed.  ?Constitutional:   ?   General: She is not in acute distress. ?   Appearance: She is well-developed.  ?HENT:  ?   Head: Normocephalic and atraumatic.  ?Eyes:  ?   Conjunctiva/sclera: Conjunctivae normal.  ?Cardiovascular:  ?   Rate and Rhythm: Normal rate and regular rhythm.  ?   Heart sounds: No murmur heard. ?Pulmonary:  ?   Effort: Pulmonary effort is normal. No respiratory distress.  ?   Breath sounds: Normal breath sounds.  ?Abdominal:  ?   Palpations: Abdomen is soft.  ?   Tenderness: There is no abdominal tenderness.  ?Musculoskeletal:     ?   General: No swelling.  ?   Cervical back: Neck supple.  ?Skin: ?   General: Skin is warm and dry.  ?   Capillary Refill: Capillary refill takes less than 2 seconds.  ?Neurological:  ?   Mental Status: She is alert.  ?   Comments: Alert and oriented x3, 5 out of 5 strength in upper and lower extremities, sensation to light touch intact in upper and lower extremities  ?Psychiatric:     ?   Mood and Affect: Mood normal.  ? ? ?ED Results / Procedures / Treatments   ?Labs ?(all labs ordered are listed, but only abnormal results are displayed) ?Labs Reviewed  ?  COMPREHENSIVE METABOLIC PANEL - Abnormal; Notable for the following components:  ?    Result Value  ? Potassium 3.0 (*)   ? CO2 20 (*)   ? Glucose, Bld 137 (*)   ? Total Protein 6.4 (*)   ? All other components within normal limits  ?I-STAT CHEM 8, ED - Abnormal; Notable for the following components:  ? Potassium 3.1 (*)   ? Glucose, Bld 133 (*)   ? Calcium, Ion 1.11 (*)   ? All other components within normal limits  ?CBG MONITORING, ED - Abnormal; Notable for the following components:  ? Glucose-Capillary 129 (*)   ? All other components within normal limits  ?I-STAT BETA HCG BLOOD, ED ([facility], [facility], [facility] ONLY) - Abnormal; Notable for the following components:  ? I-stat hCG, quantitative 6.4 (*)   ? All  other components within normal limits  ?PROTIME-INR  ?APTT  ?CBC  ?DIFFERENTIAL  ?HEMOGLOBIN A1C  ?LIPID PANEL  ?MAGNESIUM  ?HIV ANTIBODY (ROUTINE TESTING W REFLEX)  ? ? ?EKG ?None ? ?Radiology ?MR ANGIO HEAD WO CONTRAST ? ?Result Date: 07/11/2021 ?CLINICAL DATA:  Acute neurologic deficit EXAM: MRI HEAD WITHOUT CONTRAST MRA HEAD WITHOUT CONTRAST TECHNIQUE: Multiplanar, multi-echo pulse sequences of the brain and surrounding structures were acquired without intravenous contrast. Angiographic images of the Circle of Willis were acquired using MRA technique without intravenous contrast. COMPARISON:  No pertinent prior exam. FINDINGS: MRI HEAD FINDINGS Brain: No acute infarct, mass effect or extra-axial collection. No acute or chronic hemorrhage. Normal white matter signal, parenchymal volume and CSF spaces. The midline structures are normal. Vascular: Major flow voids are preserved. Skull and upper cervical spine: Normal calvarium and skull base. Visualized upper cervical spine and soft tissues are normal. Sinuses/Orbits:No paranasal sinus fluid levels or advanced mucosal thickening. No mastoid or middle ear effusion. Normal orbits. MRA HEAD FINDINGS POSTERIOR CIRCULATION: --Vertebral arteries: Normal --Inferior cerebellar arteries: Normal. --Basilar artery: Normal. --Superior cerebellar arteries: Normal. --Posterior cerebral arteries: Normal. ANTERIOR CIRCULATION: --Intracranial internal carotid arteries: Normal. --Anterior cerebral arteries (ACA): Normal. --Middle cerebral arteries (MCA): Normal. ANATOMIC VARIANTS: None IMPRESSION: Normal MRI/MRA of the brain. Electronically Signed   By: Deatra RobinsonKevin  Herman M.D.   On: 07/11/2021 21:01  ? ?MR ANGIO NECK W WO CONTRAST ? ?Result Date: 07/11/2021 ?CLINICAL DATA:  Transient ischemic attack EXAM: MRA NECK WITHOUT AND WITH CONTRAST TECHNIQUE: Multiplanar and multiecho pulse sequences of the neck were obtained without and with intravenous contrast. Angiographic images of the neck  were obtained using MRA technique without and with intravenous contrast. CONTRAST:  6.315mL GADAVIST GADOBUTROL 1 MMOL/ML IV SOLN COMPARISON:  None. FINDINGS: Normal visualized aortic arch. Normal carotid and vertebral systems. Vertebral system is codominant. IMPRESSION: Normal MRA of the neck. Electronically Signed   By: Deatra RobinsonKevin  Herman M.D.   On: 07/11/2021 21:37  ? ?MR BRAIN WO CONTRAST ? ?Result Date: 07/11/2021 ?CLINICAL DATA:  Acute neurologic deficit EXAM: MRI HEAD WITHOUT CONTRAST MRA HEAD WITHOUT CONTRAST TECHNIQUE: Multiplanar, multi-echo pulse sequences of the brain and surrounding structures were acquired without intravenous contrast. Angiographic images of the Circle of Willis were acquired using MRA technique without intravenous contrast. COMPARISON:  No pertinent prior exam. FINDINGS: MRI HEAD FINDINGS Brain: No acute infarct, mass effect or extra-axial collection. No acute or chronic hemorrhage. Normal white matter signal, parenchymal volume and CSF spaces. The midline structures are normal. Vascular: Major flow voids are preserved. Skull and upper cervical spine: Normal calvarium and skull base. Visualized upper cervical spine and soft tissues are normal.  Sinuses/Orbits:No paranasal sinus fluid levels or advanced mucosal thickening. No mastoid or middle ear effusion. Normal orbits. MRA HEAD FINDINGS POSTERIOR CIRCULATION: --Vertebral arteries: Normal --Inferior cerebellar arteries: Normal. --Basilar artery: Normal. --Superior cerebellar arteries: Normal. --Posterior cerebral arteries: Normal. ANTERIOR CIRCULATION: --Intracranial internal carotid arteries: Normal. --Anterior cerebral arteries (ACA): Normal. --Middle cerebral arteries (MCA): Normal. ANATOMIC VARIANTS: None IMPRESSION: Normal MRI/MRA of the brain. Electronically Signed   By: Deatra Robinson M.D.   On: 07/11/2021 21:01  ? ?CT HEAD CODE STROKE WO CONTRAST ? ?Result Date: 07/11/2021 ?CLINICAL DATA:  Code stroke. Acute neuro deficit. Left visual  deficit. Left-sided numbness EXAM: CT HEAD WITHOUT CONTRAST TECHNIQUE: Contiguous axial images were obtained from the base of the skull through the vertex without intravenous contrast. RADIATION DOSE REDUCTION: Th

## 2021-07-11 NOTE — H&P (Signed)
?History and Physical  ? ?Dorothy Jackson WUJ:[id number] DOB: 24-Feb-1966 DOA: 07/11/2021 ? ?PCP: Elizabeth PalauAnderson, Teresa, FNP  ? ?Patient coming from: Home ? ?Chief Complaint: Left-sided weakness ? ?HPI: Dorothy Jackson is a 56 y.o. female with medical history significant of migraine, liver cyst presenting after focal neurologic deficit. ? 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  This was followed by left-sided headache with ear pain described as sharp.  She then developed left-sided numbness and heaviness with slurred speech. ? ?Per report, on arrival EMS noted the patient had bilateral upper extremity tonic contractions with some facial twitching as well that lasted around 30 seconds. ? ?She denies fevers, chills, chest pain, shortness of breath, abdominal pain, constipation, diarrhea, nausea, vomiting. ? ?ED Course: Vital signs in ED stable.  Lab work-up included BMP with potassium 3.0, bicarb 20, glucose 137.  CBC within normal limits.  PT, INR, PTT within normal limits.  CT head was without acute abnormality.  MRI brain was without acute abnormality.  MRA of the head and neck was without acute abnormality.  Patient received aspirin in the ED.  Neurology was consulted and recommended stroke work-up and EEG for evaluation of possible seizure. ? ?Review of Systems: As per HPI otherwise all other systems reviewed and are negative. ? ?Past Medical History:  ?Diagnosis Date  ? 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 Formatting of this note might be different from the original. Migraine Headache  10/1 IMO update  ? Migraine   ? ? ?Past Surgical History:  ?Procedure Laterality Date  ? ANKLE SURGERY    ? TUBAL LIGATION    ? ? ?Social History ? has no history on file for tobacco use, alcohol use, and drug use. ? ?Allergies  ?Allergen Reactions  ? Carbamazepine Hives  ? ? ?Family History  ?Problem Relation Age of Onset  ?  Colon cancer Other   ?Reviewed on admission ? ?Prior to Admission medications   ?Medication Sig Start Date End Date Taking? Authorizing Provider  ?amitriptyline (ELAVIL) 10 MG tablet Take 10 mg by mouth daily. 05/09/21  Yes [provider]  ?OZEMPIC, 1 MG/DOSE, 4 MG/3ML SOPN Inject 1 mg into the skin every Thursday. 06/28/21  Yes [provider]  ?rizatriptan (MAXALT) 10 MG tablet Take 10 mg by mouth daily as needed for migraine. 06/22/21  Yes [provider]  ?rosuvastatin (CRESTOR) 10 MG tablet Take 10 mg by mouth at bedtime. 06/07/21  Yes [provider]  ?tiZANidine (ZANAFLEX) 4 MG tablet Take 2-4 mg by mouth at bedtime. 05/08/21  Yes [provider]  ? ? ?Physical Exam: ?Vitals:  ? 07/11/21 1930 07/11/21 1945 07/11/21 2200 07/11/21 2236  ?BP: 119/71 116/75 115/73   ?Pulse: 83 71 62 72  ?Resp: 16 15 (!) 9 19  ?Temp:    97.7 ?F (36.5 ?C)  ?TempSrc:    Temporal  ?SpO2: 100% 99% 100% 100%  ?Weight:      ?Height:      ? ? ?Physical Exam ?Constitutional:   ?   General: She is not in acute distress. ?   Appearance: Normal appearance.  ?HENT:  ?   Head: Normocephalic and atraumatic.  ?   Mouth/Throat:  ?   Mouth: Mucous membranes are moist.  ?   Pharynx: Oropharynx is clear.  ?Eyes:  ?   Extraocular Movements: Extraocular movements intact.  ?   Pupils:  Pupils are equal, round, and reactive to light.  ?Cardiovascular:  ?   Rate and Rhythm: Normal rate and regular rhythm.  ?   Pulses: Normal pulses.  ?   Heart sounds: Normal heart sounds.  ?Pulmonary:  ?   Effort: Pulmonary effort is normal. No respiratory distress.  ?   Breath sounds: Normal breath sounds.  ?Abdominal:  ?   General: Bowel sounds are normal. There is no distension.  ?   Palpations: Abdomen is soft.  ?   Tenderness: There is no abdominal tenderness.  ?Musculoskeletal:     ?   General: No swelling or deformity.  ?Skin: ?   General: Skin is warm and dry.  ?Neurological:  ?   Mental Status: Mental status is at baseline.   ?   Comments: Mental Status: ?Patient is awake, alert, oriented x3 ?No signs of aphasia or neglect ?Cranial Nerves: ?II: Pupils equal, round, and reactive to light.   ?III,IV, VI: EOMI without ptosis or diploplia.  ?V: Facial sensation is symmetric to light touch. ?VII: Facial movement is symmetric.  ?VIII: hearing is intact to voice ?X: Uvula elevates symmetrically ?XI: Shoulder shrug is symmetric. ?XII: tongue is midline without atrophy or fasciculations.  ?Motor: Good effort thorughout, at Least 5/5 bilateral UE, 5/5 bilateral lower extremitiy  ?Sensory: Sensation is grossly intact bilateral UEs & LEs ?Cerebellar: Finger-Nose intact bilalat  ? ? ?Labs on Admission: I have personally reviewed following labs and imaging studies ? ?CBC: ?Recent Labs  ?Lab 07/11/21 ?1853 07/11/21 ?1855  ?WBC 5.6  --   ?NEUTROABS 3.7  --   ?HGB 12.8 12.9  ?HCT 37.8 38.0  ?MCV 87.5  --   ?PLT 247  --   ? ? ?Basic Metabolic Panel: ?Recent Labs  ?Lab 07/11/21 ?1853 07/11/21 ?1855  ?NA 136 137  ?K 3.0* 3.1*  ?CL 103 103  ?CO2 20*  --   ?GLUCOSE 137* 133*  ?BUN 11 12  ?CREATININE 0.79 0.70  ?CALCIUM 9.3  --   ? ? ?GFR: ?Estimated Creatinine Clearance: 69.4 mL/min (by C-G formula based on SCr of 0.7 mg/dL). ? ?Liver Function Tests: ?Recent Labs  ?Lab 07/11/21 ?1853  ?AST 25  ?ALT 27  ?ALKPHOS 43  ?BILITOT 0.9  ?PROT 6.4*  ?ALBUMIN 4.2  ? ? ?Urine analysis: ?No results found for: COLORURINE, APPEARANCEUR, LABSPEC, PHURINE, GLUCOSEU, HGBUR, BILIRUBINUR, KETONESUR, PROTEINUR, UROBILINOGEN, NITRITE, LEUKOCYTESUR ? ?Radiological Exams on Admission: ?MR ANGIO HEAD WO CONTRAST ? ?Result Date: 07/11/2021 ?CLINICAL DATA:  Acute neurologic deficit EXAM: MRI HEAD WITHOUT CONTRAST MRA HEAD WITHOUT CONTRAST TECHNIQUE: Multiplanar, multi-echo pulse sequences of the brain and surrounding structures were acquired without intravenous contrast. Angiographic images of the Circle of Willis were acquired using MRA technique without intravenous contrast.  COMPARISON:  No pertinent prior exam. FINDINGS: MRI HEAD FINDINGS Brain: No acute infarct, mass effect or extra-axial collection. No acute or chronic hemorrhage. Normal white matter signal, parenchymal volume and CSF spaces. The midline structures are normal. Vascular: Major flow voids are preserved. Skull and upper cervical spine: Normal calvarium and skull base. Visualized upper cervical spine and soft tissues are normal. Sinuses/Orbits:No paranasal sinus fluid levels or advanced mucosal thickening. No mastoid or middle ear effusion. Normal orbits. MRA HEAD FINDINGS POSTERIOR CIRCULATION: --Vertebral arteries: Normal --Inferior cerebellar arteries: Normal. --Basilar artery: Normal. --Superior cerebellar arteries: Normal. --Posterior cerebral arteries: Normal. ANTERIOR CIRCULATION: --Intracranial internal carotid arteries: Normal. --Anterior cerebral arteries (ACA): Normal. --Middle cerebral arteries (MCA): Normal. ANATOMIC VARIANTS: None IMPRESSION: Normal MRI/MRA of the  brain. Electronically Signed   By: Deatra Robinson M.D.   On: 07/11/2021 21:01  ? ?MR ANGIO NECK W WO CONTRAST ? ?Result Date: 07/11/2021 ?CLINICAL DATA:  Transient ischemic attack EXAM: MRA NECK WITHOUT AND WITH CONTRAST TECHNIQUE: Multiplanar and multiecho pulse sequences of the neck were obtained without and with intravenous contrast. Angiographic images of the neck were obtained using MRA technique without and with intravenous contrast. CONTRAST:  6.12mL GADAVIST GADOBUTROL 1 MMOL/ML IV SOLN COMPARISON:  None. FINDINGS: Normal visualized aortic arch. Normal carotid and vertebral systems. Vertebral system is codominant. IMPRESSION: Normal MRA of the neck. Electronically Signed   By: Deatra Robinson M.D.   On: 07/11/2021 21:37  ? ?MR BRAIN WO CONTRAST ? ?Result Date: 07/11/2021 ?CLINICAL DATA:  Acute neurologic deficit EXAM: MRI HEAD WITHOUT CONTRAST MRA HEAD WITHOUT CONTRAST TECHNIQUE: Multiplanar, multi-echo pulse sequences of the brain and  surrounding structures were acquired without intravenous contrast. Angiographic images of the Circle of Willis were acquired using MRA technique without intravenous contrast. COMPARISON:  No pertinent prior exam. Leonides [place]

## 2021-07-11 NOTE — ED Triage Notes (Signed)
Pt BIB [facility], LSN 1715 today, pt driving home from work when she developed "haziness" to the left eye, left sided headache, ear pain, slurred speech and left sided numbness. On EMS arrival, pt noted facial twitching to the left side and bilateral arm contractures.  ?

## 2021-07-11 NOTE — ED Notes (Signed)
Pt remains in MRI, will updated MNIHSS and vital on return to department ?

## 2021-07-11 NOTE — Code Documentation (Signed)
Stroke Response Nurse Documentation ?Code Documentation ? ?Dorothy Jackson is a 56 y.o. female arriving to [facility]  via [facility] on 07/11/21 with past medical hx of HLD, seizures. On No antithrombotic. Code stroke was activated by EMS.  ? ?Patient from work where she was LKW at 87. She reports she was driving down the road and vision in her left eye got blurry and suddenly had numbness on her right side. She states her arms contracted up which EMS confirms however this resolved en route. ? ?Stroke team at the bedside on patient arrival. Labs drawn and patient cleared for CT by EDP. Patient to CT with team. NIHSS 0, see documentation for details and code stroke times. Patient with resolved symptoms on arrival. The following imaging was completed:  CT Head. Patient is not a candidate for IV Thrombolytic due to "too mild to treat". Patient is not not a candidate for IR due to no suspected LVO.  ? ?Care Plan: Q30 NIHSS and VS until out of the window at 2145 then Q2 NIHSS and VS. ? ?Bedside handoff with ED RN. ? ?Toniann Fail  ?Stroke Response RN ? ? ?

## 2021-07-11 NOTE — Consult Note (Signed)
Neurology Consult H&P ? ?Silvestre Moment ?MR# [id number] ?07/11/2021 ? ? ?CC: right arm numbness and heaviness ? ?History is obtained from: patient and chart. ? 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  ? ?EMS noted that on her arrival, she had bilateral upper extremity tonic contractures and facial twitching to the left side which lasted about 30 minutes.  ? ?Years ago had episode of syncope and treated with tegretol until cardiology diagnosed with vasovagal syncope and tegretol d/c'd ? ?LKW: 1715 ?tNK given: No too mild to treat ?IR Thrombectomy No, not indicated ?Modified Rankin Scale: 0-Completely asymptomatic and back to baseline post- stroke ?NIHSS: 0 ? ?ROS: A complete ROS was performed and is negative except as noted in the HPI.  ? ?No past medical history on file. ? ? ?No family history on file. ? ?Social History:  has no history on file for tobacco use, alcohol use, and drug use. ? ? ?Prior to Admission medications   ?Not on File  ? ? ?Exam: ?Current vital signs: ?BP 127/71 (BP Location: Right Arm)   Pulse 71   Temp 97.6 ?F (36.4 ?C) (Temporal)   Resp 14   Wt 63 kg   SpO2 100%  ? ?Physical Exam  ?Constitutional: Appears well-developed and well-nourished.  ?Psych: Affect appropriate to situation ?Eyes: No scleral injection ?HENT: No OP obstruction. ?Head: Normocephalic.  ?Cardiovascular: Normal rate and regular rhythm.  ?Respiratory: Effort normal, symmetric excursions bilaterally, no audible wheezing. ?GI: Soft.  No distension. There is no tenderness.  ?Skin: WDI ? ?Neuro: ?Mental Status: ?Patient is awake, alert, oriented to person, place, month, year, and situation. ?Patient is able to give a clear and coherent history. ?Speech  fluent, intact comprehension and repetition. ?No  signs of aphasia or neglect. ?Visual Fields are full. Pupils are equal, round, and reactive to light. ?EOMI without ptosis or diplopia.  ?Facial sensation is symmetric to temperature ?Facial movement is symmetric.  ?Hearing is intact to voice. ?Uvula midline and palate elevates symmetrically. ?Shoulder shrug is symmetric. ?Tongue is midline without atrophy or fasciculations.  ?Tone is normal. Bulk is normal. 5/5 strength was present in all four extremities. ?Sensation is symmetric to light touch and temperature in the arms and legs. ?Deep Tendon Reflexes: 2+ and symmetric in the biceps and patellae. ?Toes are downgoing bilaterally. ?FNF and HKS are intact bilaterally. ?Gait - Deferred ? ?I have reviewed labs in epic and the pertinent results are: ?CBG 129 ? ?I have reviewed the images obtained: ?NCT head showed no acute ischemic changes. ? ?Assessment: Dorothy Jackson is a 56 y.o. female PMHx as noted above with symptoms suggesting TIA (left arm heaviness) which has since completely resolved. ? ?She states that she has HLD and was just started on statin. ? ?She was also witnessed to have bilateral upper extremity clonic contraction with associated left face twitching and eyes open and will need to evaluate for possible seizure as well. ? ?Recommended aspirin 324mg  now. ? ?Plan: ?- MRI brain without contrast - ordered. ?- Vascular imaging with MRA head and neck - ordered. ?- Recommend TTE. ?- Recommend labs: HbA1c, lipid panel. ?- Recommend Statin for goal LDL <70. ?- Goal A1c <7. ?- Aspirin 81mg  daily. ?- Clopidogrel 75mg  daily for 3 weeks. ?- SBP goal <180. ?-  Permissive hypertension first 24 h < 220/110.  ?- Telemetry monitoring for arrhythmia. ?- Recommend bedside Swallow screen. ?- Recommend Stroke education. ?- Recommend PT/OT/SLP consult. ?- Routine EEG - ordered. ? ? ?This patient is critically ill and at significant risk of neurological worsening, death and care requires constant monitoring of vital signs,  hemodynamics,respiratory and cardiac monitoring, neurological assessment, discussion with family, other specialists and medical decision making of high complexity. I spent 75 minutes of neurocritical care time  in the care of  this patient. This was time spent independent of any time provided by nurse practitioner or PA. ? ?Electronically signed by:  ?Marisue Humble, MD ?Page: [phone] ?07/11/2021, 7:15 PM ? ?If 7pm- 7am, please page neurology on call as listed in AMION. ? ?

## 2021-07-11 NOTE — ED Notes (Signed)
Patient transported to MRI 

## 2021-07-12 ENCOUNTER — Observation Stay (HOSPITAL_COMMUNITY): Payer: Managed Care, Other (non HMO)

## 2021-07-12 ENCOUNTER — Other Ambulatory Visit: Payer: Self-pay

## 2021-07-12 ENCOUNTER — Encounter (HOSPITAL_COMMUNITY): Payer: Self-pay | Admitting: Internal Medicine

## 2021-07-12 ENCOUNTER — Observation Stay (HOSPITAL_BASED_OUTPATIENT_CLINIC_OR_DEPARTMENT_OTHER): Payer: Managed Care, Other (non HMO)

## 2021-07-12 DIAGNOSIS — E876 Hypokalemia: Secondary | ICD-10-CM

## 2021-07-12 DIAGNOSIS — G459 Transient cerebral ischemic attack, unspecified: Secondary | ICD-10-CM | POA: Diagnosis not present

## 2021-07-12 DIAGNOSIS — G43109 Migraine with aura, not intractable, without status migrainosus: Secondary | ICD-10-CM

## 2021-07-12 LAB — RENAL FUNCTION PANEL
Albumin: 4 g/dL (ref 3.5–5.0)
Anion gap: 11 (ref 5–15)
BUN: 10 mg/dL (ref 6–20)
CO2: 20 mmol/L — ABNORMAL LOW (ref 22–32)
Calcium: 9.1 mg/dL (ref 8.9–10.3)
Chloride: 107 mmol/L (ref 98–111)
Creatinine, Ser: 0.77 mg/dL (ref 0.44–1.00)
GFR, Estimated: >60 mL/min (ref >60)
Glucose, Bld: 84 mg/dL (ref 70–99)
Phosphorus: 3.9 mg/dL (ref 2.5–4.6)
Potassium: 3.8 mmol/L (ref 3.5–5.1)
Sodium: 138 mmol/L (ref 135–145)

## 2021-07-12 LAB — LIPID PANEL
Cholesterol: 143 mg/dL (ref 0–200)
HDL: 63 mg/dL (ref >40)
LDL Cholesterol: 75 mg/dL (ref 0–99)
Total CHOL/HDL Ratio: 2.3 RATIO
Triglycerides: 27 mg/dL (ref <150)
VLDL: 5 mg/dL (ref 0–40)

## 2021-07-12 LAB — MAGNESIUM
Magnesium: 1.9 mg/dL (ref 1.7–2.4)
Magnesium: 2.2 mg/dL (ref 1.7–2.4)

## 2021-07-12 LAB — HEMOGLOBIN A1C
Hgb A1c MFr Bld: 5 % (ref 4.8–5.6)
Mean Plasma Glucose: 96.8 mg/dL

## 2021-07-12 LAB — ECHOCARDIOGRAM COMPLETE
AR max vel: 3.59 cm2
AV Area VTI: 3.6 cm2
AV Area mean vel: 3.12 cm2
AV Mean grad: 4 mmHg
AV Peak grad: 8.2 mmHg
Ao pk vel: 1.43 m/s
Area-P 1/2: 3.1 cm2
Height: 64 in
MV VTI: 3.56 cm2
S' Lateral: 2.8 cm
Weight: 2222.24 oz

## 2021-07-12 LAB — HIV ANTIBODY (ROUTINE TESTING W REFLEX): HIV Screen 4th Generation wRfx: NONREACTIVE

## 2021-07-12 MED ORDER — ASPIRIN 81 MG PO TBEC: 81.0000 mg | DELAYED_RELEASE_TABLET | Freq: Every day | ORAL | 11 refills | Status: AC

## 2021-07-12 MED ORDER — AMITRIPTYLINE HCL 10 MG PO TABS
10.0000 mg | ORAL_TABLET | Freq: Every day | ORAL | Status: DC
Start: 2021-07-12 — End: 2021-07-13
  Administered 2021-07-12: 10 mg via ORAL
  Filled 2021-07-12 (×2): qty 1

## 2021-07-12 MED ORDER — ASPIRIN EC 81 MG PO TBEC
81.0000 mg | DELAYED_RELEASE_TABLET | Freq: Every day | ORAL | Status: DC
  Administered 2021-07-12: 81 mg via ORAL
  Filled 2021-07-12: qty 1

## 2021-07-12 NOTE — Plan of Care (Signed)

## 2021-07-12 NOTE — Progress Notes (Addendum)
STROKE TEAM PROGRESS NOTE  ? ?ATTENDING NOTE: ?I reviewed above note and agree with the assessment and plan. Pt was seen and examined.  ? ?56 year old female with history of migraine admitted for left visual field blurry vision while driving, followed by left-sided headache and ear pain, then started to have left sided numbness and heaviness with slurred speech.  She stopped in a parking lot and then started to have bilateral upper extremity tonic drawing up on her chest for 2-3 min and ? possible left facial twitching for 30 sec. EMS sent her to ED. During the episode, she had no LOC, clearly remembers the entire event which last about 74min in total.  ? ?She stated that she had chronic migraine, 4-6 times per months, each episode lasting about a 2 hours to 24 hours.  Normally happens in the morning she had whole head throbbing headache with photophobia and nausea vomiting but no phonophobia.  She takes amitriptyline but not effective.  She has not seen a headache specialist. ? ?Work-up including CT, MRI and MRA head and neck all unremarkable.  2D echo pending, LDL 75, A1c 5.0.  Creatinine 0.70.  EEG normal. ? ?Etiology for patient's episode concerning for complicated migraine.  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  Recommend continue aspirin 81 and home Crestor 10.  Okay to continue amitriptyline for now but will refer her to see Dr. Billey Gosling headache specialist at [facility] for further evaluation.  Patient is in agreement. ? ?For detailed assessment and plan, please refer to above as I have made changes wherever appropriate.  ? ?I had long discussion with patient and husband at bedside, updated pt current condition, treatment plan and potential prognosis, and answered all the questions.  They expressed understanding and appreciation.   ? ?Neurology will sign off. Please call with questions. Pt will follow up with Dr. Billey Gosling at [facility] in about 4 weeks. Thanks for the consult. ? ?Rosalin Hawking, MD PhD ?Stroke Neurology ?07/12/2021 ?5:14 PM ? ? ? ?INTERVAL HISTORY ?Her husband is at the bedside. Patient was seen reclined, awake, alert, in no distress.  ?Patient reported on 07/13/2021, L ear pain at around at work 12:30pm, which progressed to L side headache at 2p, that was throbbing in nature. Had associated left sided "heaviness" and slurred speech. Following, while driving, she had b/l upper extremity posturing, where arms were flexed centrally. This was sustained for about 20 mins. EMS arrived at noted left facial twitch that lasted 2-3 mins. During this time patient denied LOC, memory loss, urinary/fecal incontinence.  ?She reported h/o migraines, described as frontally located and throbbing, with associated n/v, photophobia. Episode lasts ~2-24hrs, occurring about 4-6x/monthly. Currently on amitriptyline 10 mg qHS for migraine ppx and rizatriptan 10 mg for abortive therapy. Patient is not able to tell if amitriptyline is helping. She describes her migraine as fluctuating frequency throughout the year. She reported similar pattern of migraine frequency multiple times in the past.  ? ? ?Vitals:  ? 07/12/21 0624 07/12/21 0831 07/12/21 1129 07/12/21 1347  ?BP: 101/67 97/65 104/63 114/64  ?Pulse: 66 79 68 80  ?Resp:  14 14 16   ?Temp: 98.5 ?F (36.9 ?C) 98.1 ?F (36.7 ?C) 98.2 ?F (36.8 ?C)   ?TempSrc: Oral Oral Oral   ?SpO2: 100% 100% 100% 100%  ?Weight:      ?  Height:      ? ?CBC:  ?Recent Labs  ?Lab 07/11/21 ?1853 07/11/21 ?1855  ?WBC 5.6  --   ?NEUTROABS 3.7  --   ?HGB 12.8 12.9  ?HCT 37.8 38.0  ?MCV 87.5  --   ?PLT 247  --   ? ?Basic Metabolic Panel:  ?Recent Labs  ?Lab 07/11/21 ?1853 07/11/21 ?1855 07/12/21 ?0131  ?NA 136 137 138  ?K 3.0* 3.1* 3.8  ?CL 103 103 107  ?CO2 20*  --  20*  ?GLUCOSE 137* 133* 84  ?BUN 11 12 10   ?CREATININE 0.79 0.70 0.77  ?CALCIUM  9.3  --  9.1  ?MG 1.9  --  2.2  ?PHOS  --   --  3.9  ? ?Lipid Panel:  ?Recent Labs  ?Lab 07/12/21 ?0131  ?CHOL 143  ?TRIG 27  ?HDL 63  ?CHOLHDL 2.3  ?VLDL 5  ?[place] 75  ? ?HgbA1c:  ?Recent Labs  ?Lab 07/12/21 ?0131  ?HGBA1C 5.0  ? ?Urine Drug Screen: No results for input(s): LABOPIA, COCAINSCRNUR, LABBENZ, AMPHETMU, THCU, LABBARB in the last 168 hours.  ?Alcohol Level No results for input(s): ETH in the last 168 hours. ? ?IMAGING past 24 hours ?MR ANGIO HEAD WO CONTRAST ? ?Result Date: 07/11/2021 ?CLINICAL DATA:  Acute neurologic deficit EXAM: MRI HEAD WITHOUT CONTRAST MRA HEAD WITHOUT CONTRAST TECHNIQUE: Multiplanar, multi-echo pulse sequences of the brain and surrounding structures were acquired without intravenous contrast. Angiographic images of the Circle of Willis were acquired using MRA technique without intravenous contrast. COMPARISON:  No pertinent prior exam. FINDINGS: MRI HEAD FINDINGS Brain: No acute infarct, mass effect or extra-axial collection. No acute or chronic hemorrhage. Normal white matter signal, parenchymal volume and CSF spaces. The midline structures are normal. Vascular: Major flow voids are preserved. Skull and upper cervical spine: Normal calvarium and skull base. Visualized upper cervical spine and soft tissues are normal. Sinuses/Orbits:No paranasal sinus fluid levels or advanced mucosal thickening. No mastoid or middle ear effusion. Normal orbits. MRA HEAD FINDINGS POSTERIOR CIRCULATION: --Vertebral arteries: Normal --Inferior cerebellar arteries: Normal. --Basilar artery: Normal. --Superior cerebellar arteries: Normal. --Posterior cerebral arteries: Normal. ANTERIOR CIRCULATION: --Intracranial internal carotid arteries: Normal. --Anterior cerebral arteries (ACA): Normal. --Middle cerebral arteries (MCA): Normal. ANATOMIC VARIANTS: None IMPRESSION: Normal MRI/MRA of the brain. Electronically Signed   By: Ulyses Jarred M.D.   On: 07/11/2021 21:01  ? ?MR ANGIO NECK W WO  CONTRAST ? ?Result Date: 07/11/2021 ?CLINICAL DATA:  Transient ischemic attack EXAM: MRA NECK WITHOUT AND WITH CONTRAST TECHNIQUE: Multiplanar and multiecho pulse sequences of the neck were obtained without and with intravenous contrast. Angiographic images of the neck were obtained using MRA technique without and with intravenous contrast. CONTRAST:  6.53mL GADAVIST GADOBUTROL 1 MMOL/ML IV SOLN COMPARISON:  None. FINDINGS: Normal visualized aortic arch. Normal carotid and vertebral systems. Vertebral system is codominant. IMPRESSION: Normal MRA of the neck. Electronically Signed   By: Ulyses Jarred M.D.   On: 07/11/2021 21:37  ? ?MR BRAIN WO CONTRAST ? ?Result Date: 07/11/2021 ?CLINICAL DATA:  Acute neurologic deficit EXAM: MRI HEAD WITHOUT CONTRAST MRA HEAD WITHOUT CONTRAST TECHNIQUE: Multiplanar, multi-echo pulse sequences of the brain and surrounding structures were acquired without intravenous contrast. Angiographic images of the Circle of Willis were acquired using MRA technique without intravenous contrast. COMPARISON:  No pertinent prior exam. FINDINGS: MRI HEAD FINDINGS Brain: No acute infarct, mass effect or extra-axial collection. No acute or chronic hemorrhage. Normal white matter signal, parenchymal volume and CSF spaces. The midline structures are  normal. Vascular: Major flow voids are preserved. Skull and upper cervical spine: Normal calvarium and skull base. Visualized upper cervical spine and soft tissues are normal. Sinuses/Orbits:No paranasal sinus fluid levels or advanced mucosal thickening. No mastoid or middle ear effusion. Normal orbits. MRA HEAD FINDINGS POSTERIOR CIRCULATION: --Vertebral arteries: Normal --Inferior cerebellar arteries: Normal. --Basilar artery: Normal. --Superior cerebellar arteries: Normal. --Posterior cerebral arteries: Normal. ANTERIOR CIRCULATION: --Intracranial internal carotid arteries: Normal. --Anterior cerebral arteries (ACA): Normal. --Middle cerebral arteries (MCA):  Normal. ANATOMIC VARIANTS: None IMPRESSION: Normal MRI/MRA of the brain. Electronically Signed   By: Ulyses Jarred M.D.   On: 07/11/2021 21:01  ? ?EEG adult ? ?Result Date: 07/12/2021 ?Lora Havens, MD     3/23/2

## 2021-07-12 NOTE — Progress Notes (Signed)
EEG complete - results pending 

## 2021-07-12 NOTE — Evaluation (Signed)
Physical Therapy Evaluation and DISCHARGE ?Patient Details ?Name: Dorothy Jackson ?MRN: [id number] ?DOB: 04-17-1966 ?Today's Date: 07/12/2021 ? ?History of Present Illness ? Dorothy Jackson is a 56 y.o. female who presents to ED with haziness in L visual field of L eye, L sided headache with ear pain, L sided numbness/heaviness with slurred speech. Imaging negative. PMH:  migraine, liver cyst presenting after focal neurologic deficit. ?  ?Clinical Impression ? Pt is functioning at baseline. All symptoms have resolved. Pt functioning independently. Pt educated on "BE FAST" with good understanding. Pt with no further skilled acute PT needs at this time. Acute PT SIGNING OFF. Please re-consult if needed in future.   ?   ? ?Recommendations for follow up therapy are one component of a multi-disciplinary discharge planning process, led by the attending physician.  Recommendations may be updated based on patient status, additional functional criteria and insurance authorization. ? ?Follow Up Recommendations No PT follow up ? ?  ?Assistance Recommended at Discharge None  ?Patient can return home with the following ?   ? ?  ?Equipment Recommendations None recommended by PT  ?Recommendations for Other Services ?    ?  ?Functional Status Assessment Patient has not had a recent decline in their functional status (symptoms have resolved)  ? ?  ?Precautions / Restrictions Precautions ?Precautions: None ?Restrictions ?Weight Bearing Restrictions: No  ? ?  ? ?Mobility ? Bed Mobility ?Overal bed mobility: Independent ?  ?  ?  ?  ?  ?  ?General bed mobility comments: pt with adjustable bed at home, [facility] elevated ?  ? ?Transfers ?Overall transfer level: Independent ?Equipment used: None ?  ?  ?  ?  ?  ?  ?  ?General transfer comment: no difficulty or instability, no dizziness ?  ? ?Ambulation/Gait ?Ambulation/Gait assistance: Independent ?Gait Distance (Feet): 300 Feet ?Assistive device: None ?Gait Pattern/deviations: WFL(Within  Functional Limits) ?Gait velocity: wfl ?Gait velocity interpretation: >4.37 ft/sec, indicative of normal walking speed ?  ?General Gait Details: WFL, no LOB ? ?Stairs ?Stairs: Yes ?Stairs assistance: Independent ?Stair Management: No rails, Forwards, Alternating pattern ?Number of Stairs: 10 ?General stair comments: no difficulty ? ?Wheelchair Mobility ?  ? ?Modified Rankin (Stroke Patients Only) ?Modified Rankin (Stroke Patients Only) ?Pre-Morbid Rankin Score: No symptoms ?Modified Rankin: No symptoms ? ?  ? ?Balance Overall balance assessment: No apparent balance deficits (not formally assessed) ?  ?  ?  ?  ?  ?  ?  ?  ?  ?  ?  ?  ?  ?  ?  ?Standardized Balance Assessment ?Standardized Balance Assessment : Dynamic Gait Index ?  ?Dynamic Gait Index ?Level Surface: Normal ?Change in Gait Speed: Normal ?Gait with Horizontal Head Turns: Normal ?Gait with Vertical Head Turns: Normal ?Gait and Pivot Turn: Normal ?Step Over Obstacle: Normal ?Step Around Obstacles: Normal ?Steps: Normal ?Total Score: 24 ?   ? ? ? ?Pertinent Vitals/Pain Pain Assessment ?Pain Assessment: No/denies pain  ? ? ?Home Living Family/patient expects to be discharged to:: Private residence ?Living Arrangements: Spouse/significant other;Children ?Available Help at Discharge: Family;Available 24 hours/day (spouse works but has lots of family that can provide 24/7 assist) ?Type of Home: House ?Home Access: Stairs to enter ?Entrance Stairs-Rails: Left ?Entrance Stairs-Number of Steps: 6 ?  ?Home Layout: Multi-level;Able to live on main level with bedroom/bathroom ?Home Equipment: Shower seat - built in ?   ?  ?Prior Function Prior Level of Function : Independent/Modified Independent;Working/employed ?  ?  ?  ?  ?  ?  ?  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  ? ? ?Hand Dominance  ? Dominant Hand: Right ? ?  ?Extremity/Trunk Assessment  ? Upper Extremity Assessment ?Upper Extremity Assessment:  Overall WFL for tasks assessed ?  ? ?Lower Extremity Assessment ?Lower Extremity Assessment: Overall WFL for tasks assessed ?  ? ?Cervical / Trunk Assessment ?Cervical / Trunk Assessment: Normal  ?Communication  ? Communication: No difficulties  ?Cognition Arousal/Alertness: Awake/alert ?Behavior During Therapy: [facility] for tasks assessed/performed ?Overall Cognitive Status: Within Functional Limits for tasks assessed ?  ?  ?  ?  ?  ?  ?  ?  ?  ?  ?  ?  ?  ?  ?  ?  ?  ?  ?  ? ?  ?General Comments General comments (skin integrity, edema, etc.): VSS ? ?  ?Exercises    ? ?Assessment/Plan  ?  ?PT Assessment Patient does not need any further PT services  ?PT Problem List   ? ?   ?  ?PT Treatment Interventions     ? ?PT Goals (Current goals can be found in the Care Plan section)  ?Acute Rehab PT Goals ?Patient Stated Goal: home today ?PT Goal Formulation: All assessment and education complete, DC therapy ? ?  ?Frequency   ?  ? ? ?Co-evaluation   ?  ?  ?  ?  ? ? ?  ?AM-PAC PT "6 Clicks" Mobility  ?Outcome Measure Help needed turning from your back to your side while in a flat bed without using bedrails?: None ?Help needed moving from lying on your back to sitting on the side of a flat bed without using bedrails?: None ?Help needed moving to and from a bed to a chair (including a wheelchair)?: None ?Help needed standing up from a chair using your arms (e.g., wheelchair or bedside chair)?: None ?Help needed to walk in hospital room?: None ?Help needed climbing 3-5 steps with a railing? : None ?6 Click Score: 24 ? ?  ?End of Session Equipment Utilized During Treatment: Gait belt ?Activity Tolerance: Patient tolerated treatment well ?Patient left: in chair;with call bell/phone within reach ?Nurse Communication: Mobility status ?PT Visit Diagnosis: Unsteadiness on feet (R26.81) ?  ? ?Time: 9326-7124 ?PT Time Calculation (min) (ACUTE ONLY): 19 min ? ? ?Charges:   PT Evaluation ?$PT Eval Low Complexity: 1 Low ?  ?  ?   ? ? ?Lewis Shock, PT, DPT ?Acute Rehabilitation Services ?Pager #: [phone] ?Office #: [phone] ? ? ?Jerri Glauser M Laisa Larrick ?07/12/2021, 8:59 AM ? ?

## 2021-07-12 NOTE — Progress Notes (Signed)
?  Echocardiogram ?2D Echocardiogram has been performed. ? ?Dorothy Jackson ?07/12/2021, 5:09 PM ?

## 2021-07-12 NOTE — Procedures (Signed)
Patient Name: Dorothy Jackson  ?MRN: [id number]  ?Epilepsy Attending: Charlsie Quest  ?Referring Physician/Provider: Rica Mote, MD ?Date: 07/12/2021 ?Duration: 22.37 mins ? ?Patient history: 56 year old female with episode of bilateral upper extremity tonic contraction associated with left facial twitching.  EEG to evaluate for seizure. ? ?Level of alertness: Awake, asleep ? ?AEDs during EEG study:  ? ?Technical aspects: This EEG study was done with scalp electrodes positioned according to the 10-20 International system of electrode placement. Electrical activity was acquired at a sampling rate of 500Hz  and reviewed with a high frequency filter of 70Hz  and a low frequency filter of 1Hz . EEG data were recorded continuously and digitally stored.  ? ?Description: The posterior dominant rhythm consists of 10 Hz activity of moderate voltage (25-35 uV) seen predominantly in posterior head regions, symmetric and reactive to eye opening and eye closing. Sleep was characterized by vertex waves, sleep spindles (12 to 14 Hz), maximal frontocentral region.  Hyperventilation and photic stimulation were not performed.    ? ?IMPRESSION: ?This study is within normal limits. No seizures or epileptiform discharges were seen throughout the recording. ? ?  ? ?

## 2021-07-12 NOTE — Plan of Care (Signed)
?  Problem: Education: ?Goal: Knowledge of General Education information will improve ?Description: Including pain rating scale, medication(s)/side effects and non-pharmacologic comfort measures ?Outcome: Progressing ?  ?Problem: Health Behavior/Discharge Planning: ?Goal: Ability to manage health-related needs will improve ?Outcome: Progressing ?  ?Problem: Pain Managment: ?Goal: General experience of comfort will improve ?Outcome: Progressing ?  ?Problem: Safety: ?Goal: Ability to remain free from injury will improve ?Outcome: Progressing ?  ?Problem: Education: ?Goal: Knowledge of disease or condition will improve ?Outcome: Progressing ?Goal: Knowledge of secondary prevention will improve (SELECT ALL) ?Outcome: Progressing ?  ?

## 2021-07-12 NOTE — Discharge Summary (Incomplete)
?Physician Discharge Summary ?  ?Patient: Dorothy Jackson MRN: [id number] DOB: 25-Jul-1965  ?Admit date:     07/11/2021  ?Discharge date: {dischdate:26783}  ?Discharge Physician: Barnetta ChapelSylvester I Thaddeaus Monica  ? ?PCP: Elizabeth PalauAnderson, Teresa, FNP  ? ?Recommendations at discharge:  ?{Tip this will not be part of the note when signed- Example include specific recommendations for outpatient follow-up, pending tests to follow-up on. ?(Optional):26781} ? *** ? ?Discharge Diagnoses: ?Principal Problem: ?  TIA (transient ischemic attack) ?Active Problems: ?  Seizure-like activity (HCC) ?  Migraines ?  Hypokalemia ? ?Resolved Problems: ?  * No resolved hospital problems. * ? ?Hospital Course: ?No notes on file ? ?Assessment and Plan: ?No notes have been filed under this hospital service. ?Service: Hospitalist ? ? ? ? {Tip this will not be part of the note when signed Body mass index is 23.84 kg/m?. ?, ,  (Optional):26781} ? ?{(NOTE) Pain control PDMP Statment (Optional):26782} ?Consultants: *** ?Procedures performed: ***  ?Disposition: {Plan; Disposition:26390} ?Diet recommendation:  ?Discharge Diet Orders (From admission, onward)  ? ?  Start     Ordered  ? 07/12/21 0000  Diet - low sodium heart healthy       ? 07/12/21 1259  ? ?  ?  ? ?  ? ?{Diet_Plan:26776} ?DISCHARGE MEDICATION: ?Allergies as of 07/12/2021   ? ?   Reactions  ? Carbamazepine Hives  ? ?  ? ?  ?Medication List  ?  ? ?TAKE these medications   ? ?amitriptyline 10 MG tablet ?Commonly known as: ELAVIL ?Take 10 mg by mouth daily. ?  ?aspirin 81 MG EC tablet ?Take 1 tablet (81 mg total) by mouth daily. Swallow whole. ?Start taking on: July 13, 2021 ?  ?Ozempic (1 MG/DOSE) 4 MG/3ML Sopn ?Generic drug: Semaglutide (1 MG/DOSE) ?Inject 1 mg into the skin every Thursday. ?  ?rizatriptan 10 MG tablet ?Commonly known as: MAXALT ?Take 10 mg by mouth daily as needed for migraine. ?  ?rosuvastatin 10 MG tablet ?Commonly known as: CRESTOR ?Take 10 mg by mouth at bedtime. ?  ?tiZANidine 4 MG  tablet ?Commonly known as: ZANAFLEX ?Take 2-4 mg by mouth at bedtime. ?  ? ?  ? ? Follow-up Information   ? ? Ocie Doynehima, Jennifer, MD. Schedule an appointment as soon as possible for a visit in 1 month(s).   ?Specialty: Neurology ?Contact information: ?[address], [address] ?[place] [id number] ?[phone] ? ? ?  ?  ? ?  ?  ? ?  ? ?Discharge Exam: ?Filed Weights  ? 07/11/21 1800  ?Weight: 63 kg  ? ?*** ? ?Condition at discharge: {DC Condition:26389} ? ?The results of significant diagnostics from this hospitalization (including imaging, microbiology, ancillary and laboratory) are listed below for reference.  ? ?Imaging Studies: ?MR ANGIO HEAD WO CONTRAST ? ?Result Date: 07/11/2021 ?CLINICAL DATA:  Acute neurologic deficit EXAM: MRI HEAD WITHOUT CONTRAST MRA HEAD WITHOUT CONTRAST TECHNIQUE: Multiplanar, multi-echo pulse sequences of the brain and surrounding structures were acquired without intravenous contrast. Angiographic images of the Circle of Willis were acquired using MRA technique without intravenous contrast. COMPARISON:  No pertinent prior exam. FINDINGS: MRI HEAD FINDINGS Brain: No acute infarct, mass effect or extra-axial collection. No acute or chronic hemorrhage. Normal white matter signal, parenchymal volume and CSF spaces. The midline structures are normal. Vascular: Major flow voids are preserved. Skull and upper cervical spine: Normal calvarium and skull base. Visualized upper cervical spine and soft tissues are normal. Sinuses/Orbits:No paranasal sinus fluid levels or advanced mucosal thickening. No mastoid  or middle ear effusion. Normal orbits. MRA HEAD FINDINGS POSTERIOR CIRCULATION: --Vertebral arteries: Normal --Inferior cerebellar arteries: Normal. --Basilar artery: Normal. --Superior cerebellar arteries: Normal. --Posterior cerebral arteries: Normal. ANTERIOR CIRCULATION: --Intracranial internal carotid arteries: Normal. --Anterior cerebral arteries (ACA): Normal. --Middle cerebral arteries  (MCA): Normal. ANATOMIC VARIANTS: None IMPRESSION: Normal MRI/MRA of the brain. Electronically Signed   By: Deatra Robinson M.D.   On: 07/11/2021 21:01  ? ?MR ANGIO NECK W WO CONTRAST ? ?Result Date: 07/11/2021 ?CLINICAL DATA:  Transient ischemic attack EXAM: MRA NECK WITHOUT AND WITH CONTRAST TECHNIQUE: Multiplanar and multiecho pulse sequences of the neck were obtained without and with intravenous contrast. Angiographic images of the neck were obtained using MRA technique without and with intravenous contrast. CONTRAST:  6.83mL GADAVIST GADOBUTROL 1 MMOL/ML IV SOLN COMPARISON:  None. FINDINGS: Normal visualized aortic arch. Normal carotid and vertebral systems. Vertebral system is codominant. IMPRESSION: Normal MRA of the neck. Electronically Signed   By: Deatra Robinson M.D.   On: 07/11/2021 21:37  ? ?MR BRAIN WO CONTRAST ? ?Result Date: 07/11/2021 ?CLINICAL DATA:  Acute neurologic deficit EXAM: MRI HEAD WITHOUT CONTRAST MRA HEAD WITHOUT CONTRAST TECHNIQUE: Multiplanar, multi-echo pulse sequences of the brain and surrounding structures were acquired without intravenous contrast. Angiographic images of the Circle of Willis were acquired using MRA technique without intravenous contrast. COMPARISON:  No pertinent prior exam. FINDINGS: MRI HEAD FINDINGS Brain: No acute infarct, mass effect or extra-axial collection. No acute or chronic hemorrhage. Normal white matter signal, parenchymal volume and CSF spaces. The midline structures are normal. Vascular: Major flow voids are preserved. Skull and upper cervical spine: Normal calvarium and skull base. Visualized upper cervical spine and soft tissues are normal. Sinuses/Orbits:No paranasal sinus fluid levels or advanced mucosal thickening. No mastoid or middle ear effusion. Normal orbits. MRA HEAD FINDINGS POSTERIOR CIRCULATION: --Vertebral arteries: Normal --Inferior cerebellar arteries: Normal. --Basilar artery: Normal. --Superior cerebellar arteries: Normal. --Posterior  cerebral arteries: Normal. ANTERIOR CIRCULATION: --Intracranial internal carotid arteries: Normal. --Anterior cerebral arteries (ACA): Normal. --Middle cerebral arteries (MCA): Normal. ANATOMIC VARIANTS: None IMPRESSION: Normal MRI/MRA of the brain. Electronically Signed   By: Deatra Robinson M.D.   On: 07/11/2021 21:01  ? ?EEG adult ? ?Result Date: 07/12/2021 ?Charlsie Quest, MD     07/12/2021 10:46 AM Patient Name: Dorothy Jackson MRN: [id number] Epilepsy Attending: Charlsie Quest Referring Physician/Provider: Rica Mote, MD Date: 07/12/2021 Duration: 22.37 mins Patient history: 56 year old female with episode of bilateral upper extremity tonic contraction associated with left facial twitching.  EEG to evaluate for seizure. Level of alertness: Awake, asleep AEDs during EEG study: Technical aspects: This EEG study was done with scalp electrodes positioned according to the 10-20 International system of electrode placement. Electrical activity was acquired at a sampling rate of 500Hz  and reviewed with a high frequency filter of 70Hz  and a low frequency filter of 1Hz . EEG data were recorded continuously and digitally stored. Description: The posterior dominant rhythm consists of 10 Hz activity of moderate voltage (25-35 uV) seen predominantly in posterior head regions, symmetric and reactive to eye opening and eye closing. Sleep was characterized by vertex waves, sleep spindles (12 to 14 Hz), maximal frontocentral region.  Hyperventilation and photic stimulation were not performed.   IMPRESSION: This study is within normal limits. No seizures or epileptiform discharges were seen throughout the recording. Priyanka  ? ?CT HEAD CODE STROKE WO CONTRAST ? ?Result Date: 07/11/2021 ?CLINICAL DATA:  Code stroke. Acute neuro deficit. Left visual deficit. Left-sided numbness  EXAM: CT HEAD WITHOUT CONTRAST TECHNIQUE: Contiguous axial images were obtained from the base of the skull through the vertex without  intravenous contrast. RADIATION DOSE REDUCTION: This exam was performed according to the departmental dose-optimization program which includes automated exposure control, adjustment of the mA and/or kV accord

## 2021-07-12 NOTE — Progress Notes (Signed)
Speech Language Pathology Treatment:    ?Patient Details ?Name: Dorothy Jackson ?MRN: [id number] ?DOB: November 08, 1965 ?Today's Date: 07/12/2021 ?Time:  -  ?  ? 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 She reported jumbled speech very briefly yesterday which has completely resolved. She denies past or present cognitive difficulties. Full assessment is not presently warranted.  ?  ? ? ?Royce Macadamia ? ?07/12/2021, 9:38 AM ?

## 2021-07-12 NOTE — Evaluation (Signed)
Occupational Therapy Evaluation ?Patient Details ?Name: Dorothy Jackson ?MRN: [id number] ?DOB: [birth year] ?Today's Date: 07/12/2021 ? ? ?History of Present Illness Dorothy Jackson is a 56 y.o. female who presents to ED with haziness in L visual field of L eye, L sided headache with ear pain, L sided numbness/heaviness with slurred speech. Imaging negative. PMH:  migraine, liver cyst presenting after focal neurologic deficit.  ? ?Clinical Impression ?  ?Pt admitted for concerns listed above. PTA pt reported that she was independent with all ADL's and IADL's, including working in health care. At this time, pt appears to be back to her baseline, no weakness, blurred vision, or slurred speech noted. Vision and cognition appear to be [facility]. Pt has no further OT concerns and acute OT will sign off.   ?   ? ?Recommendations for follow up therapy are one component of a multi-disciplinary discharge planning process, led by the attending physician.  Recommendations may be updated based on patient status, additional functional criteria and insurance authorization.  ? ?Follow Up Recommendations ? No OT follow up  ?  ?Assistance Recommended at Discharge None  ?Patient can return home with the following   ? ?  ?Functional Status Assessment ? Patient has had a recent decline in their functional status and demonstrates the ability to make significant improvements in function in a reasonable and predictable amount of time.  ?Equipment Recommendations ? None recommended by OT  ?  ?Recommendations for Other Services   ? ? ?  ?Precautions / Restrictions Precautions ?Precautions: None ?Restrictions ?Weight Bearing Restrictions: No  ? ?  ? ?Mobility Bed Mobility ?Overal bed mobility: Independent ?  ?  ?  ?  ?  ?  ?General bed mobility comments: pt with adjustable bed at home, [facility] elevated ?  ? ?Transfers ?Overall transfer level: Independent ?Equipment used: None ?  ?  ?  ?  ?  ?  ?  ?General transfer comment: no difficulty or  instability, no dizziness ?  ? ?  ?Balance Overall balance assessment: Independent ?  ?  ?  ?  ?  ?  ?  ?  ?  ?  ?  ?  ?  ?  ?  ?  ?  ?  ?   ? ?ADL either performed or assessed with clinical judgement  ? ?ADL Overall ADL's : Independent;At baseline ?  ?  ?  ?  ?  ?  ?  ?  ?  ?  ?  ?  ?  ?  ?  ?  ?  ?  ?  ?General ADL Comments: No concerns noted  ? ? ? ?Vision Baseline Vision/History: 1 Wears glasses ?Ability to See in Adequate Light: 0 Adequate ?Patient Visual Report: No change from baseline ?Vision Assessment?: No apparent visual deficits  ?   ?Perception   ?  ?Praxis   ?  ? ?Pertinent Vitals/Pain Pain Assessment ?Pain Assessment: No/denies pain  ? ? ? ?Hand Dominance Right ?  ?Extremity/Trunk Assessment Upper Extremity Assessment ?Upper Extremity Assessment: Overall WFL for tasks assessed ?  ?Lower Extremity Assessment ?Lower Extremity Assessment: Overall WFL for tasks assessed ?  ?Cervical / Trunk Assessment ?Cervical / Trunk Assessment: Normal ?  ?Communication Communication ?Communication: No difficulties ?  ?Cognition Arousal/Alertness: Awake/alert ?Behavior During Therapy: [facility] for tasks assessed/performed ?Overall Cognitive Status: Within Functional Limits for tasks assessed ?  ?  ?  ?  ?  ?  ?  ?  ?  ?  ?  ?  ?  ?  ?  ?  ?  ?  ?  ?  General Comments  VSS on RA, husband in room, supportive ? ?  ?Exercises   ?  ?Shoulder Instructions    ? ? ?Home Living Family/patient expects to be discharged to:: Private residence ?Living Arrangements: Spouse/significant other;Children ?Available Help at Discharge: Family;Available 24 hours/day ?Type of Home: House ?Home Access: Stairs to enter ?Entrance Stairs-Number of Steps: 6 ?Entrance Stairs-Rails: Left ?Home Layout: Multi-level;Able to live on main level with bedroom/bathroom ?  ?  ?Bathroom Shower/Tub: Walk-in shower ?  ?Bathroom Toilet: Standard ?  ?  ?Home Equipment: Shower seat - built in ?  ?  ?  ? ?  ?Prior Functioning/Environment Prior Level of Function :  Independent/Modified Independent;Working/employed ?  ?  ?  ?  ?  ?  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  ? ?  ?  ?OT Problem List: Decreased strength;Decreased activity tolerance;Impaired balance (sitting and/or standing) ?  ?   ?OT Treatment/Interventions:    ?  ?OT Goals(Current goals can be found in the care plan section) Acute Rehab OT Goals ?Patient Stated Goal: To go home ?OT Goal Formulation: With patient ?Time For Goal Achievement: 07/12/21 ?Potential to Achieve Goals: Good  ?OT Frequency:   ?  ? ?Co-evaluation   ?  ?  ?  ?  ? ?  ?AM-PAC OT "6 Clicks" Daily Activity     ?Outcome Measure Help from another person eating meals?: None ?Help from another person taking care of personal grooming?: None ?Help from another person toileting, which includes using toliet, bedpan, or urinal?: None ?Help from another person bathing (including washing, rinsing, drying)?: None ?Help from another person to put on and taking off regular upper body clothing?: None ?Help from another person to put on and taking off regular lower body clothing?: None ?6 Click Score: 24 ?  ?End of Session Nurse Communication: Mobility status ? ?Activity Tolerance: Patient tolerated treatment well ?Patient left: in bed;with call bell/phone within reach;with family/visitor present ? ?OT Visit Diagnosis: Unsteadiness on feet (R26.81);Other abnormalities of gait and mobility (R26.89);Muscle weakness (generalized) (M62.81)  ?              ?Time: 3354-5625 ?OT Time Calculation (min): 9 min ?Charges:  OT General Charges ?$OT Visit: 1 Visit ?OT Evaluation ?$OT Eval Low Complexity: 1 Low ? ?Glendy Barsanti H., OTR/L ?Acute Rehabilitation ? ?Raela Bohl Elane Bing Plume ?07/12/2021, 12:29 PM ?

## 2021-07-12 NOTE — TOC Transition Note (Signed)
Transition of Care (TOC) - CM/SW Discharge Note ? ? ?Patient Details  ?Name: Dorothy Jackson ?MRN: [id number] ?Date of Birth: 09-17-1965 ? ?Transition of Care (TOC) CM/SW Contact:  ?Kermit Balo, RN ?Phone Number: ?07/12/2021, 1:01 PM ? ? ?Clinical Narrative:    ?Patient is discharging home with self care. No needs per TOC. ? ? ?Final next level of care: Home/Self Care ?Barriers to Discharge: No Barriers Identified ? ? ?Patient Goals and CMS Choice ?  ?  ?  ? ?Discharge Placement ?  ?           ?  ?  ?  ?  ? ?Discharge Plan and Services ?  ?  ?           ?  ?  ?  ?  ?  ?  ?  ?  ?  ?  ? ?Social Determinants of Health (SDOH) Interventions ?  ? ? ?Readmission Risk Interventions ?   ? View : No data to display.  ?  ?  ?  ? ? ? ? ? ?

## 2021-08-12 NOTE — Discharge Summary (Signed)
?Physician Discharge Summary ?  ?Patient: Dorothy Jackson MRN: [id number] DOB: [birth year]  ?Admit date:     07/11/2021  ?Discharge date: 07/12/2021  ?Discharge Physician: Bonnell Public  ? ?PCP: Vicenta Aly, FNP  ? ?Recommendations at discharge:  ? ? Follow up with PCP within 1 week of discharge ?Follow up with the headache specialist ? ?Discharge Diagnoses: ?Principal Problem: ?  TIA (transient ischemic attack) ?Active Problems: ?  Complicated migraine ?  Seizure-like activity ([place]) ?  Migraines ?  Hypokalemia ? ?Resolved Problems: ?  * No resolved hospital problems. * ? ?Hospital Course: ?Patient is a 56 year old female with history of migraine admitted for left visual field blurry vision while driving, followed by left-sided headache and ear pain, then started to have left sided numbness and heaviness with slurred speech.  She stopped in a parking lot and then started to have bilateral upper extremity tonic drawing up on her chest for 2-3 min and ? possible left facial twitching for 30 sec. EMS sent her to ED. During the episode, she had no LOC, clearly remembers the entire event which last about 59min in total.  ?  ?Patient stated that she had chronic migraine, 4-6 times per months, each episode lasting about a 2 hours to 24 hours.  Normally happens in the morning she had whole head throbbing headache with photophobia and nausea vomiting but no phonophobia.  She takes amitriptyline but not effective.  She has not seen a headache specialist. ?  ?Work-up including CT, MRI and MRA head and neck all unremarkable.  2D echo pending, LDL 75, A1c 5.0.  Creatinine 0.70.  EEG normal. ?  ?Etiology for patient's episode concerning for complicated migraine.  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  Neurology was consulted to assist with patient's management.  Neurology recommended continuing aspirin 81 and home Crestor 10, and also advised continuing  amitriptyline for now, and for patient to see Dr. Billey Gosling, headache specialist at [facility] for further evaluation.    ? ? ?Assessment and Plan: ?Complicated migraine (occular + basilar)/Rule out TIA ?Code Stroke CT head: No acute abnormality.  ?MRI:  No acute infarct ?MRA:  Normal MRA of the neck and brain ?2D Echo Pending ?EEG: wnl ?LDL 75 ?HgbA1c 5.0 ?VTE prophylaxis - Enoxaparin (lovenox) injection 40 mg start: 07/11/21 2200  ?aspirin 81 mg daily prior to admission, now on aspirin 81 mg daily. Continue on discharge. ?Therapy recommendations:  No PT follow up No OT follow up (07/12/21)  ?Disposition: Home pending Echo read ?  ?Recurrent Migraine ?Home meds: Amitriptyline 10 mg qHS, rizatriptan 10 mg PRN ?Many year h/o migraine - b/l frontal, photophobia, n/v. Lasting 2-24hrs. Occurs 4-6x/month ?Follow-up with migraine specialist Dr. Billey Gosling after discharge.  ?  ?Hypertension ?Home meds:  none ?Stable ?Long-term BP goal normotensive ?  ?Hyperlipidemia ?Home meds:  rosuvastatin 10 mg daily, resumed in hospital given at goal LDL ?LDL 75, goal < 100 ?Continue statin at discharge ?  ?  ?Consultants: Neurology ?Procedures performed: EEG to evaluate seizures - No seizures ?Disposition: Home ?Diet recommendation:  ?Discharge Diet Orders (From admission, onward)  ? ?  Start     Ordered  ? 07/12/21 0000  Diet - low sodium heart healthy       ? 07/12/21 1259  ? ?  ?  ? ?  ? ?Cardiac diet ?DISCHARGE MEDICATION: ?Allergies as of 07/12/2021   ? ?   Reactions  ? Carbamazepine Hives  ? ?  ? ?  ?  Medication List  ?  ? ?TAKE these medications   ? ?amitriptyline 10 MG tablet ?Commonly known as: ELAVIL ?Take 10 mg by mouth daily. ?  ?aspirin 81 MG EC tablet ?Take 1 tablet (81 mg total) by mouth daily. Swallow whole. ?  ?Ozempic (1 MG/DOSE) 4 MG/3ML Sopn ?Generic drug: Semaglutide (1 MG/DOSE) ?Inject 1 mg into the skin every Thursday. ?  ?rizatriptan 10 MG tablet ?Commonly known as: MAXALT ?Take 10 mg by mouth daily as needed for migraine. ?   ?rosuvastatin 10 MG tablet ?Commonly known as: CRESTOR ?Take 10 mg by mouth at bedtime. ?  ?tiZANidine 4 MG tablet ?Commonly known as: ZANAFLEX ?Take 2-4 mg by mouth at bedtime. ?  ? ?  ? ? Follow-up Information   ? ? Genia Harold, MD. Schedule an appointment as soon as possible for a visit in 1 month(s).   ?Specialty: Neurology ?Contact information: ?[address], [address] ?[place] [address] ?[phone] ? ? ?  ?  ? ?  ?  ? ?  ? ?Discharge Exam: ?Filed Weights  ? 07/11/21 1800  ?Weight: 63 kg  ? ? ? ?Condition at discharge: stable ? ?The results of significant diagnostics from this hospitalization (including imaging, microbiology, ancillary and laboratory) are listed below for reference.  ? ?Imaging Studies: ?No results found. ? ?Microbiology: ?No results found for this or any previous visit. ? ?Labs: ?CBC: ?No results for input(s): WBC, NEUTROABS, HGB, HCT, MCV, PLT in the last 168 hours. ?Basic Metabolic Panel: ?No results for input(s): NA, K, CL, CO2, GLUCOSE, BUN, CREATININE, CALCIUM, MG, PHOS in the last 168 hours. ?Liver Function Tests: ?No results for input(s): AST, ALT, ALKPHOS, BILITOT, PROT, ALBUMIN in the last 168 hours. ?CBG: ?No results for input(s): GLUCAP in the last 168 hours. ? ?Discharge time spent: greater than 30 minutes. ? ?Signed: ?Bonnell Public, MD ?[facility] ?08/12/2021 ?

## 2021-08-29 ENCOUNTER — Encounter: Payer: Self-pay | Admitting: Psychiatry

## 2021-08-29 ENCOUNTER — Ambulatory Visit: Payer: Managed Care, Other (non HMO) | Admitting: Psychiatry

## 2021-08-29 VITALS — BP 120/77 | HR 67 | Ht 65.0 in | Wt 133.0 lb

## 2021-08-29 DIAGNOSIS — M542 Cervicalgia: Secondary | ICD-10-CM

## 2021-08-29 DIAGNOSIS — G43109 Migraine with aura, not intractable, without status migrainosus: Secondary | ICD-10-CM | POA: Diagnosis not present

## 2021-08-29 MED ORDER — SUMATRIPTAN SUCCINATE 50 MG PO TABS: 50.0000 mg | ORAL_TABLET | ORAL | 3 refills | Status: AC | PRN

## 2021-08-29 MED ORDER — AMITRIPTYLINE HCL 25 MG PO TABS: 25.0000 mg | ORAL_TABLET | Freq: Every day | ORAL | 3 refills | Status: DC

## 2021-08-29 NOTE — Progress Notes (Signed)
? ?Referring:  ?Marvel Plan, MD ?[address] ?[address] ?[place],  [place] [address] ? ?PCP: ?Elizabeth Palau, FNP ? ?Neurology was asked to evaluate Dorothy Jackson, a 56 year old female for a chief complaint of headaches.  Our recommendations of care will be communicated by shared medical record.   ? ?CC:  headaches, blurred vision, numbness ? ?History provided from self ? ?HPI:  ?Medical co-morbidities: migraines ? ?The patient was recently hospitalized 07/11/21 after she developed an episode of blurred vision in her left eye, left sided numbness/tingling and heaviness, slurred speech, and headache. She first developed a headache which was a left sided shooting pain with associated photophobia and nausea. Then she developed blurry vision in her left field of vision which lasted for 1 minute. She then developed left arm numbness and tingling for 1 minute. Then bilateral arms became tense and drew up in a flexed posture for 2-3 minutes. She was taken to the ED in an ambulance. In the ambulance the right side of her face started twitching which lasted for 5 minutes. She did not lose consciousness. In the ED she underwent MRI, MRA and EEG which were unremarkable. LDL was 75 and A1c was 5.0. TTE was negative for shunt. Headache lasted for about 9 hours until it finally resolved. ? ?She has had 4 headaches since this episode, but has not had any more vision changes, numbness, or twitching. She typically has ~12 migraines per month. ? ?The patient has a history of migraine and takes amitriptyline 10 mg QHS and tizanidine QHS for prevention. She takes rizatriptan 10 mg PRN for rescue. Feels rizatriptan has been less effective lately and only works about 25% of the time. She was taking ASA 81 prior to this episode and continues to take it. ? ?Headache History: ?Onset: age 62 ?Triggers: neck pain ?Aura: blurry vision, numbness ?Location: right ?Quality/Description: throbbing ?Associated Symptoms: ? Photophobia:  yes ? Phonophobia: no ? Nausea: yes ?Worse with activity?: yes ?Duration of headaches: 24 hours ? ?Headache days per month: 12 ?Headache free days per month: 18 ? ?Current Treatment: ?Abortive ?Rizatriptan 10 mg PRN ? ?Preventative ?Amitriptyline 10 mg QHS ? ?Prior Therapies                                 ?Rizatriptan 10 mg PRN ?Amitriptyline 10 mg QHS ?Topamax - lack of efficacy ? ? ?LABS: ?CBC ?   ?Component Value Date/Time  ? WBC 5.6 07/11/2021 1853  ? RBC 4.32 07/11/2021 1853  ? HGB 12.9 07/11/2021 1855  ? HCT 38.0 07/11/2021 1855  ? PLT 247 07/11/2021 1853  ? MCV 87.5 07/11/2021 1853  ? MCH 29.6 07/11/2021 1853  ? MCHC 33.9 07/11/2021 1853  ? RDW 12.0 07/11/2021 1853  ? LYMPHSABS 1.5 07/11/2021 1853  ? MONOABS 0.3 07/11/2021 1853  ? EOSABS 0.0 07/11/2021 1853  ? BASOSABS 0.0 07/11/2021 1853  ? ? ?  Latest Ref Rng & Units 07/12/2021  ?  1:31 AM 07/11/2021  ?  6:55 PM 07/11/2021  ?  6:53 PM  ?CMP  ?Glucose 70 - 99 mg/dL 84   643   329    ?BUN 6 - 20 mg/dL 10   12   11     ?Creatinine 0.44 - 1.00 mg/dL   5.18   8.41    ?Sodium 135 - 145 mmol/L 138   137   136    ?Potassium 3.5 -  5.1 mmol/L 3.8   3.1   3.0    ?Chloride 98 - 111 mmol/L 107   103   103    ?CO2 22 - 32 mmol/L 20    20    ?Calcium 8.9 - 10.3 mg/dL 9.1    9.3    ?Total Protein 6.5 - 8.1 g/dL   6.4    ?Total Bilirubin 0.3 - 1.2 mg/dL   0.9    ?Alkaline Phos 38 - 126 U/L   43    ?AST 15 - 41 U/L   25    ?ALT 0 - 44 U/L   27    ? ? ? ?IMAGING:  ?07/11/21 MRI/MRA brain unremarkable ? ?07/11/21 EEG normal ? ?Imaging independently reviewed on Aug 29, 2021  ? ?Current Outpatient Medications on File Prior to Visit  ?Medication Sig Dispense Refill  ? aspirin EC 81 MG EC tablet Take 1 tablet (81 mg total) by mouth daily. Swallow whole. 30 tablet 11  ? OZEMPIC, 1 MG/DOSE, 4 MG/3ML SOPN Inject 1 mg into the skin every Thursday.    ? rosuvastatin (CRESTOR) 10 MG tablet Take 10 mg by mouth at bedtime.    ? tiZANidine (ZANAFLEX) 4 MG tablet Take 2-4 mg by mouth at  bedtime.    ? ?No current facility-administered medications on file prior to visit.  ? ? ? ?Allergies: ?Allergies  ?Allergen Reactions  ? Carbamazepine Hives  ? ? ?Family History: ?Migraine or other headaches in the family:  no ?Aneurysms in a first degree relative:  no ?Brain tumors in the family:  no ?Other neurological illness in the family:   uncle had ALS ? ?Past Medical History: ?Past Medical History:  ?Diagnosis Date  ? 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 Formatting of this note might be different from the original. Migraine Headache  10/1 IMO update  ? Migraine   ? ? ?Past Surgical History ?Past Surgical History:  ?Procedure Laterality Date  ? ANKLE SURGERY    ? TUBAL LIGATION    ? ? ?Social History: ?Social History  ? ?Tobacco Use  ? Smoking status: Never  ? Smokeless tobacco: Never  ?Vaping Use  ? Vaping Use: Never used  ?Substance Use Topics  ? Drug use: Never  ? ? ?ROS: ?Negative for fevers, chills. Positive for headaches, vision changes, numbness. All other systems reviewed and negative unless stated otherwise in HPI. ? ? ?Physical Exam:  ? ?Vital Signs: ?BP 120/77   Pulse 67   Ht 5\' 5"  (1.651 m)   Wt 133 lb (60.3 kg)   BMI 22.13 kg/m?  ?GENERAL: well appearing,in no acute distress,alert ?SKIN:  Color, texture, turgor normal. No rashes or lesions ?HEAD:  Normocephalic/atraumatic. ?CV:  RRR ?RESP: Normal respiratory effort ?MSK: +tenderness to palpation over bilateral occiput, neck, and shoulders ? ?NEUROLOGICAL: ?Mental Status: Alert, oriented to person, place and time,Follows commands ?Cranial Nerves: PERRL, visual fields intact to confrontation, extraocular movements intact, facial sensation intact, no facial droop or ptosis, hearing grossly intact, no dysarthria ?Motor: muscle strength 5/5 both upper and lower extremities ?Reflexes: 2+ throughout ?Sensation: intact to light touch all 4 extremities ?Coordination: Finger-to- nose-finger intact bilaterally ?Gait: normal-based ? ? ?IMPRESSION: ?56  year old female with a history of migraines who presents for evaluation of an episode of headache associated with vision changes, numbness, and bilateral arm posturing. MRI, MRA, and EEG were unremarkable. Suspect her symptoms are secondary to migraine aura. Posturing is not a common migraine symptom, but suspicion for seizure is  relatively low given normal EEG and retained consciousness with bilateral arm involvement. Could consider repeating EEG if twitching or posturing reoccurs. For migraine will increase amitriptyline to 25 mg QHS and switch Maxalt to Imitrex for rescue. Will refer to neck PT as she has significant neck pain and tension on exam. ? ?PLAN: ?-Prevention: Increase amitriptyline to 25 mg QHS ?-Rescue: Stop Maxalt. Start Imitrex 50 mg PRN ?-Referral to neck PT ?-next steps: consider CGRP, consider repeat EEG if posturing/twitching reoccurs ? ? ?I spent a total of 40 minutes chart reviewing and counseling the patient. Headache education was done. Discussed treatment options including preventive and acute medications, and physical therapy. Discussed medication side effects, adverse reactions and drug interactions. Written educational materials and patient instructions outlining all of the above were given. ? ?Follow-up: 4 months ? ? ?Ocie Doyne, MD ?08/29/2021   ?11:20 AM ? ? ?

## 2021-08-29 NOTE — Patient Instructions (Addendum)
Start Imitrex (sumatriptan) as needed for migraine. Take at the onset of migraine. If headache recurs or does not fully resolve, you may take a second dose after 2 hours. Please avoid taking more than 2 days per week or 10 days per month. ?Increase amitriptyline to 25 mg at bedtime for migraine prevention ?Referral to physical therapy for neck pain ?Stroke Risk and Migraine ? ?There is a slight increase in risk of stroke in patients who have with migraine with aura. This risk is higher in women than it is in men with migraine. This increased risk is not present in patients who have migraine without aura. This overall risk of stroke with migraine with aura remains very low, and a stroke is far more likely to occur from other factors like smoking and high blood pressure. The best way to reduce your risk of stroke is to modify these other factors. ? ?Ways to reduce stroke risk: ?-Avoid smoking ?-Maintain an active lifestyle and eat healthy ?-Follow with your family doctor for routine monitoring of blood pressure, blood sugar, and cholesterol ?-If you do have diabetes, high blood pressure, or high cholesterol, consistently take your medications as prescribed by your doctor ?-Discuss with your headache doctor before starting contraceptive pills or hormone replacement therapy with estrogen. Taking estrogen can increase stroke risk in migraine with aura. Low dose estrogen (10 mcg) may be okay depending on your other risk factors. ?-Avoid estrogen-based contraceptives above age 18 if you have migraine aura ? ?Ways to tell migraine from stroke: ?Migraine auras can be confused with transient ischemic attack (TIA), where someone has stroke symptoms that pass in a short time. Migraine auras can have many different symptoms including vision changes (zigzags, kaleidoscope vision, flashing lights, blurred vision), numbness and tingling (usually on once side of the hand or face), difficulty with language (reading or speaking), and  rarely weakness on one side. Many times these will be followed by a migraine headache, but auras can occur without a headache as well. ? ?Like a stroke, a migraine can be sudden and can lead to mild confusion. However, migraine aura symptoms tend to develop relatively slowly and then spread and intensify, while the symptoms of a TIA or stroke are sudden. Migraine can sometimes be mistaken for a stroke caused by bleeding on the brain, which is often characterised by a sudden, very severe headache. Unlike a bleeding stroke, migraine headache is usually one-sided and throbbing, slow to come on and lasts for a shorter period of time. Vomiting usually starts after a migraine headache starts, but is likely to happen at the same time as headache during a brain bleed. Patients with a brain bleed also develop neck stiffness, which is less common during a migraine attack. ? ?If you are ever concerned that you are having stroke symptoms, you should go to the emergency department for evaluation. It is never wrong to get evaluated if you are experiencing new neurological symptoms. ? ?

## 2021-09-20 ENCOUNTER — Encounter: Payer: Self-pay | Admitting: Psychiatry

## 2021-09-24 ENCOUNTER — Other Ambulatory Visit: Payer: Self-pay | Admitting: Psychiatry

## 2021-09-24 ENCOUNTER — Other Ambulatory Visit: Payer: Self-pay

## 2021-09-24 MED ORDER — EMGALITY 120 MG/ML ~~LOC~~ SOAJ
1.0000 | SUBCUTANEOUS | 6 refills | Status: DC
Start: 2021-09-24 — End: 2021-11-05

## 2021-09-24 MED ORDER — EMGALITY 120 MG/ML ~~LOC~~ SOAJ: 2.0000 "pen " | Freq: Once | SUBCUTANEOUS | 0 refills | Status: AC

## 2021-10-01 ENCOUNTER — Telehealth: Payer: Self-pay | Admitting: *Deleted

## 2021-10-01 NOTE — Telephone Encounter (Signed)
Called pharmacy help desk for [facility], spoke with Arlys John, answered clinical questions.   Decision is 24-72 hour turn around. Decision via fax. Case # [id number].

## 2021-10-04 NOTE — Telephone Encounter (Signed)
Emgality PA approved for 2 pens for 28 days then max of 1 fill for 30 days 10/26/21- 03/25/22, Berkley Harvey #[id number]. Approval faxed to pharmacy.

## 2021-11-05 ENCOUNTER — Telehealth: Payer: Self-pay | Admitting: Psychiatry

## 2021-11-05 MED ORDER — EMGALITY 120 MG/ML ~~LOC~~ SOAJ: 1.0000 "pen " | SUBCUTANEOUS | 6 refills | Status: AC

## 2021-11-05 NOTE — Telephone Encounter (Signed)
Called insurance help desk, spoke with Dawn to do emgality 120 mg PA for maintenance dose, 1 pen/28 days. Approved on June 14th, effective 10/26/21-03/25/22. Pharmacy is running Rx for 1 pen/30 days. She did test claim for 1 pen/ 28 days supply. It went through and also went thru for 1 pen/30 days. PA [phone]. Called [facility], spoke with Beth who claimed they didn't have maintenance Rx. Resent Rx in.

## 2021-11-05 NOTE — Telephone Encounter (Signed)
Dorothy Jackson([email or web address] [phone] ) has called to report that because this is a starter dose a PA is needed for Galcanezumab-gnlm (EMGALITY) 120 MG/ML SOAJ 1 for 1

## 2022-01-17 ENCOUNTER — Ambulatory Visit: Payer: Managed Care, Other (non HMO) | Admitting: Psychiatry

## 2022-01-17 ENCOUNTER — Encounter: Payer: Self-pay | Admitting: Psychiatry

## 2022-01-17 NOTE — Progress Notes (Deleted)
   CC:  headaches  Follow-up Visit  Last visit: 08/29/21  Brief HPI: 56 year old female who follows in clinic for migraine with aura. MRI/MRA brain and EEG 07/11/21 were unremarkable.  At her last visit amitriptyline was increased to 25 mg QHS and she was started on Imitrex 50 mg PRN for rescue. She was referred to neck PT for cervicalgia.  Interval History: She did not have improvement with increased amitriptyline so she was switched to [facility] for prevention. Headaches*** Imitrex***   Headache days per month: *** Headache free days per month: *** Headache severity: ***  Current Headache Regimen: Preventative: Emgality 120 mg monthly Abortive: Imitrex 50 mg PRN  # of doses of abortive medications per month: ***  Prior Therapies                                  Rizatriptan 10 mg PRN Amitriptyline 25 mg QHS - lack of efficacy Topamax - lack of efficacy  Physical Exam:   Vital Signs: There were no vitals taken for this visit. GENERAL:  well appearing, in no acute distress, alert  SKIN:  Color, texture, turgor normal. No rashes or lesions HEAD:  Normocephalic/atraumatic. RESP: normal respiratory effort MSK:  No gross joint deformities.   NEUROLOGICAL: Mental Status: Alert, oriented to person, place and time, Follows commands, and Speech fluent and appropriate. Cranial Nerves: PERRL, face symmetric, no dysarthria, hearing grossly intact Motor: moves all extremities equally Gait: normal-based.  IMPRESSION: ***  PLAN: ***   Follow-up: ***  I spent a total of *** minutes on the date of the service. Headache education was done. Discussed lifestyle modification including increased oral hydration, decreased caffeine, exercise and stress management. Discussed treatment options including preventive and acute medications, natural supplements, and infusion therapy. Discussed medication overuse headache and to limit use of acute treatments to no more than 2 days/week or 10  days/month. Discussed medication side effects, adverse reactions and drug interactions. Written educational materials and patient instructions outlining all of the above were given.  Genia Harold, MD

## 2022-04-10 ENCOUNTER — Telehealth: Payer: Self-pay

## 2022-04-10 NOTE — Telephone Encounter (Signed)
New PA submitted via CMM Key: [id number] Please wait for [employer] 2017 to return a determination.

## 2022-05-03 ENCOUNTER — Other Ambulatory Visit: Payer: Self-pay | Admitting: Psychiatry

## 2022-10-21 ENCOUNTER — Other Ambulatory Visit: Payer: Self-pay | Admitting: *Deleted

## 2022-10-21 NOTE — Telephone Encounter (Signed)
Pt last seen on 08/29/21 No follow up scheduled

## 2023-08-13 ENCOUNTER — Other Ambulatory Visit: Payer: Self-pay

## 2023-08-13 ENCOUNTER — Emergency Department (HOSPITAL_BASED_OUTPATIENT_CLINIC_OR_DEPARTMENT_OTHER)
Admission: EM | Admit: 2023-08-13 | Discharge: 2023-08-13 | Disposition: A | Discharge status: Home / Self Care | Attending: Emergency Medicine | Admitting: Emergency Medicine

## 2023-08-13 ENCOUNTER — Emergency Department (HOSPITAL_BASED_OUTPATIENT_CLINIC_OR_DEPARTMENT_OTHER): Admitting: Radiology

## 2023-08-13 ENCOUNTER — Encounter (HOSPITAL_BASED_OUTPATIENT_CLINIC_OR_DEPARTMENT_OTHER): Payer: Self-pay | Admitting: Emergency Medicine

## 2023-08-13 DIAGNOSIS — M533 Sacrococcygeal disorders, not elsewhere classified: Secondary | ICD-10-CM

## 2023-08-13 DIAGNOSIS — W108XXA Fall (on) (from) other stairs and steps, initial encounter: Secondary | ICD-10-CM | POA: Diagnosis not present

## 2023-08-13 DIAGNOSIS — Z7982 Long term (current) use of aspirin: Secondary | ICD-10-CM | POA: Insufficient documentation

## 2023-08-13 DIAGNOSIS — W19XXXA Unspecified fall, initial encounter: Secondary | ICD-10-CM

## 2023-08-13 MED ORDER — LIDOCAINE 5 % EX PTCH: 1.0000 | MEDICATED_PATCH | CUTANEOUS | 0 refills | Status: AC

## 2023-08-13 MED ORDER — OXYCODONE-ACETAMINOPHEN 5-325 MG PO TABS
1.0000 | ORAL_TABLET | ORAL | Status: DC | PRN
  Administered 2023-08-13: 1 via ORAL
  Filled 2023-08-13: qty 1

## 2023-08-13 MED ORDER — METHOCARBAMOL 500 MG PO TABS: 500.0000 mg | ORAL_TABLET | Freq: Two times a day (BID) | ORAL | 0 refills | Status: AC

## 2023-08-13 NOTE — ED Triage Notes (Signed)
 Patient report mechanical fall and landed on tailbone. Complaining of pain to coccyx. Denies head trauma, LOC, or blood thinners. Ambulatory after fall.

## 2023-08-13 NOTE — ED Provider Notes (Signed)
 [facility] EMERGENCY DEPARTMENT AT [facility] Provider Note   CSN: [id number] Arrival date & time: 08/13/23  0550     History  Chief Complaint  Patient presents with   Dorothy Jackson    Dorothy Jackson is a 58 y.o. female.  HPI     58yo female presents with concern for coccyx and left pelvic pain after fall.  Slipped and fell down the stairs with buttock hitting the stairs as she fell down. No head trauma, neck pain, back pain, headache, nausea, vomiting, numbness, weakness, chest pain or abdominal pain.  Has been able to bear weight but with pain. If she does not move feels pain just in coccyx at rest, but if she moves her left leg does feel pulling and pain and feels like the hip pain is more muscular.   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 Migraine Headache  10/1 IMO update   Migraine      Home Medications Prior to Admission medications   Medication Sig Start Date End Date Taking? Authorizing Provider  lidocaine  (LIDODERM ) 5 % Place 1 patch onto the skin daily. Remove & Discard patch within 12 hours or as directed by MD 08/13/23  Yes Scarlette Currier, MD  methocarbamol  (ROBAXIN ) 500 MG tablet Take 1 tablet (500 mg total) by mouth 2 (two) times daily. 08/13/23  Yes Scarlette Currier, MD  amitriptyline  (ELAVIL ) 25 MG tablet TAKE 1 TABLET(25 MG) BY MOUTH AT BEDTIME 05/06/22   Dala Dublin, MD  aspirin  EC 81 MG EC tablet Take 1 tablet (81 mg total) by mouth daily. Swallow whole. 07/13/21   Doroteo Gasmen, MD  Galcanezumab -gnlm (EMGALITY ) 120 MG/ML SOAJ Inject 1 pen  into the skin every 30 (thirty) days. 11/05/21   Dala Dublin, MD  OZEMPIC, 1 MG/DOSE, 4 MG/3ML SOPN Inject 1 mg into the skin every Thursday. 06/28/21   [provider]  rosuvastatin  (CRESTOR ) 10 MG tablet Take 10 mg by mouth at bedtime. 06/07/21   [provider]  SUMAtriptan  (IMITREX ) 50 MG  tablet Take 1 tablet (50 mg total) by mouth every 2 (two) hours as needed for migraine. May repeat in 2 hours if headache persists or recurs. 08/29/21   Dala Dublin, MD      Allergies    Carbamazepine    Review of Systems   Review of Systems  Physical Exam Updated Vital Signs BP 100/62   Pulse 69   Temp 97.6 F (36.4 C) (Oral)   Resp 20   Ht 5\' 5"  (1.651 m)   Wt 52.2 kg   SpO2 100%   BMI 19.14 kg/m  Physical Exam Vitals and nursing note reviewed.  Constitutional:      General: She is not in acute distress.    Appearance: She is well-developed. She is not diaphoretic.  HENT:     Head: Normocephalic and atraumatic.  Eyes:     Conjunctiva/sclera: Conjunctivae normal.  Cardiovascular:     Rate and Rhythm: Normal rate and regular rhythm.     Pulses: Normal pulses.  Pulmonary:     Effort: Pulmonary effort is normal.  Abdominal:     General: There is no distension.     Palpations: Abdomen is soft.     Tenderness: There is no abdominal tenderness. There is no guarding.  Musculoskeletal:        General: Tenderness (coccyx and towards the left buttock, pain with movements,  no c/t/l spine, able to range left hip, bear weight bilaterally and ambulate) present.     Cervical back: Normal range of motion.  Skin:    General: Skin is warm and dry.     Findings: No erythema or rash.  Neurological:     Mental Status: She is alert and oriented to person, place, and time.     ED Results / Procedures / Treatments   Labs (all labs ordered are listed, but only abnormal results are displayed) Labs Reviewed - No data to display  EKG None  Radiology DG Sacrum/Coccyx Result Date: 08/13/2023 CLINICAL DATA:  Status post fall.  Complains of tailbone pain. EXAM: SACRUM AND COCCYX - 2+ VIEW COMPARISON:  None Available. FINDINGS: No signs of acute fracture or dislocation. No evidence for pelvic diastasis. IMPRESSION: Negative. Electronically Signed   By: Kimberley Penman M.D.   On:  08/13/2023 06:40    Procedures Procedures    Medications Ordered in ED Medications - No data to display   ED Course/ Medical Decision Making/ A&P                                    [phone] female presents with concern for coccyx and left pelvic pain after fall.  Mechanical fall, no syncope, no sign of injuries to head, c/t/l spine.   Is bearing weight without hip pain with pain more centered to tailbone and have low suspicion for hip/femur fracture and on review of XR that was obtained have low suspicion for surgical pelvic fracture.    Sacrum/coccyx XR ordered in triage without signs of fracture. Clinically given tenderness in this location on exam suspect more likely has occult coccyx fracture and/or contusion. Recommend lidoderm  patch, tylenol , ibuprofen, muscle relaxant.  Recommend weight bearing as tolerated, follow up with PCP for repeat XR/dedicated pelvic XR if continued pain in 7-10 days, return if worsening.          Final Clinical Impression(s) / ED Diagnoses Final diagnoses:  Coccydynia  Fall, initial encounter    Rx / DC Orders ED Discharge Orders          Ordered    lidocaine  (LIDODERM ) 5 %  Every 24 hours        08/13/23 0859    methocarbamol  (ROBAXIN ) 500 MG tablet  2 times daily        08/13/23 9604              Scarlette Currier, MD 08/13/23 2322
